# Patient Record
Sex: Male | Born: 1950 | Race: White | Hispanic: No | Marital: Married | Smoking: Never smoker
Health system: Southern US, Community
[De-identification: ages and names within clinical notes are randomized; demographics above are authoritative.]

## PROBLEM LIST (undated history)

## (undated) DIAGNOSIS — E7800 Pure hypercholesterolemia, unspecified: Secondary | ICD-10-CM

## (undated) DIAGNOSIS — K2970 Gastritis, unspecified, without bleeding: Secondary | ICD-10-CM

## (undated) DIAGNOSIS — N200 Calculus of kidney: Secondary | ICD-10-CM

## (undated) DIAGNOSIS — M1990 Unspecified osteoarthritis, unspecified site: Secondary | ICD-10-CM

## (undated) DIAGNOSIS — G894 Chronic pain syndrome: Secondary | ICD-10-CM

## (undated) DIAGNOSIS — M5136 Other intervertebral disc degeneration, lumbar region: Secondary | ICD-10-CM

## (undated) DIAGNOSIS — M51369 Other intervertebral disc degeneration, lumbar region without mention of lumbar back pain or lower extremity pain: Secondary | ICD-10-CM

## (undated) DIAGNOSIS — Z9889 Other specified postprocedural states: Secondary | ICD-10-CM

## (undated) DIAGNOSIS — K219 Gastro-esophageal reflux disease without esophagitis: Secondary | ICD-10-CM

## (undated) HISTORY — DX: Chronic pain syndrome: G89.4

## (undated) HISTORY — DX: Pure hypercholesterolemia, unspecified: E78.00

## (undated) HISTORY — DX: Calculus of kidney: N20.0

## (undated) HISTORY — DX: Other specified postprocedural states: Z98.890

## (undated) HISTORY — DX: Gastritis, unspecified, without bleeding: K29.70

## (undated) HISTORY — DX: Other intervertebral disc degeneration, lumbar region without mention of lumbar back pain or lower extremity pain: M51.369

## (undated) HISTORY — PX: SHOULDER SURGERY: SHX246

## (undated) HISTORY — DX: Gastro-esophageal reflux disease without esophagitis: K21.9

## (undated) HISTORY — DX: Other intervertebral disc degeneration, lumbar region: M51.36

## (undated) HISTORY — PX: CARPAL TUNNEL RELEASE: SHX101

## (undated) HISTORY — DX: Unspecified osteoarthritis, unspecified site: M19.90

---

## 2014-10-05 DIAGNOSIS — K5900 Constipation, unspecified: Secondary | ICD-10-CM | POA: Diagnosis not present

## 2014-10-05 DIAGNOSIS — E785 Hyperlipidemia, unspecified: Secondary | ICD-10-CM | POA: Diagnosis not present

## 2014-10-05 DIAGNOSIS — G5600 Carpal tunnel syndrome, unspecified upper limb: Secondary | ICD-10-CM | POA: Diagnosis not present

## 2014-10-05 DIAGNOSIS — G894 Chronic pain syndrome: Secondary | ICD-10-CM | POA: Diagnosis not present

## 2014-10-05 DIAGNOSIS — N399 Disorder of urinary system, unspecified: Secondary | ICD-10-CM | POA: Diagnosis not present

## 2014-10-05 DIAGNOSIS — M549 Dorsalgia, unspecified: Secondary | ICD-10-CM | POA: Diagnosis not present

## 2014-10-15 DIAGNOSIS — G5601 Carpal tunnel syndrome, right upper limb: Secondary | ICD-10-CM | POA: Diagnosis not present

## 2014-10-15 DIAGNOSIS — G5602 Carpal tunnel syndrome, left upper limb: Secondary | ICD-10-CM | POA: Diagnosis not present

## 2014-11-04 DIAGNOSIS — G5601 Carpal tunnel syndrome, right upper limb: Secondary | ICD-10-CM | POA: Diagnosis not present

## 2015-01-25 DIAGNOSIS — M549 Dorsalgia, unspecified: Secondary | ICD-10-CM | POA: Diagnosis not present

## 2015-01-25 DIAGNOSIS — G5600 Carpal tunnel syndrome, unspecified upper limb: Secondary | ICD-10-CM | POA: Diagnosis not present

## 2015-01-25 DIAGNOSIS — Z0000 Encounter for general adult medical examination without abnormal findings: Secondary | ICD-10-CM | POA: Diagnosis not present

## 2015-01-25 DIAGNOSIS — E785 Hyperlipidemia, unspecified: Secondary | ICD-10-CM | POA: Diagnosis not present

## 2015-01-25 DIAGNOSIS — K5900 Constipation, unspecified: Secondary | ICD-10-CM | POA: Diagnosis not present

## 2015-01-25 DIAGNOSIS — N399 Disorder of urinary system, unspecified: Secondary | ICD-10-CM | POA: Diagnosis not present

## 2015-01-25 DIAGNOSIS — G894 Chronic pain syndrome: Secondary | ICD-10-CM | POA: Diagnosis not present

## 2015-01-25 DIAGNOSIS — Z125 Encounter for screening for malignant neoplasm of prostate: Secondary | ICD-10-CM | POA: Diagnosis not present

## 2015-06-07 DIAGNOSIS — K589 Irritable bowel syndrome without diarrhea: Secondary | ICD-10-CM | POA: Diagnosis not present

## 2015-06-07 DIAGNOSIS — K5900 Constipation, unspecified: Secondary | ICD-10-CM | POA: Diagnosis not present

## 2015-08-25 DIAGNOSIS — E7800 Pure hypercholesterolemia, unspecified: Secondary | ICD-10-CM | POA: Diagnosis not present

## 2015-08-25 DIAGNOSIS — Z0000 Encounter for general adult medical examination without abnormal findings: Secondary | ICD-10-CM | POA: Diagnosis not present

## 2015-08-25 DIAGNOSIS — Z8669 Personal history of other diseases of the nervous system and sense organs: Secondary | ICD-10-CM | POA: Diagnosis not present

## 2015-08-25 DIAGNOSIS — K589 Irritable bowel syndrome without diarrhea: Secondary | ICD-10-CM | POA: Diagnosis not present

## 2015-08-25 DIAGNOSIS — G8929 Other chronic pain: Secondary | ICD-10-CM | POA: Diagnosis not present

## 2015-08-25 DIAGNOSIS — Z79899 Other long term (current) drug therapy: Secondary | ICD-10-CM | POA: Diagnosis not present

## 2015-08-25 DIAGNOSIS — R05 Cough: Secondary | ICD-10-CM | POA: Diagnosis not present

## 2015-08-30 DIAGNOSIS — R0789 Other chest pain: Secondary | ICD-10-CM | POA: Diagnosis not present

## 2015-08-30 DIAGNOSIS — R05 Cough: Secondary | ICD-10-CM | POA: Diagnosis not present

## 2016-05-24 DIAGNOSIS — Z125 Encounter for screening for malignant neoplasm of prostate: Secondary | ICD-10-CM | POA: Diagnosis not present

## 2016-05-24 DIAGNOSIS — E7800 Pure hypercholesterolemia, unspecified: Secondary | ICD-10-CM | POA: Diagnosis not present

## 2016-05-24 DIAGNOSIS — Z79899 Other long term (current) drug therapy: Secondary | ICD-10-CM | POA: Diagnosis not present

## 2016-05-24 DIAGNOSIS — R7301 Impaired fasting glucose: Secondary | ICD-10-CM | POA: Diagnosis not present

## 2016-05-24 DIAGNOSIS — R946 Abnormal results of thyroid function studies: Secondary | ICD-10-CM | POA: Diagnosis not present

## 2016-05-30 DIAGNOSIS — Z0000 Encounter for general adult medical examination without abnormal findings: Secondary | ICD-10-CM | POA: Diagnosis not present

## 2016-05-30 DIAGNOSIS — E7800 Pure hypercholesterolemia, unspecified: Secondary | ICD-10-CM | POA: Diagnosis not present

## 2016-05-30 DIAGNOSIS — R300 Dysuria: Secondary | ICD-10-CM | POA: Diagnosis not present

## 2016-05-30 DIAGNOSIS — Z79899 Other long term (current) drug therapy: Secondary | ICD-10-CM | POA: Diagnosis not present

## 2016-05-30 DIAGNOSIS — K589 Irritable bowel syndrome without diarrhea: Secondary | ICD-10-CM | POA: Diagnosis not present

## 2016-05-30 DIAGNOSIS — Z0289 Encounter for other administrative examinations: Secondary | ICD-10-CM | POA: Diagnosis not present

## 2016-05-30 DIAGNOSIS — G8929 Other chronic pain: Secondary | ICD-10-CM | POA: Diagnosis not present

## 2016-05-30 DIAGNOSIS — M25512 Pain in left shoulder: Secondary | ICD-10-CM | POA: Insufficient documentation

## 2016-05-30 HISTORY — DX: Pain in left shoulder: M25.512

## 2016-06-01 DIAGNOSIS — Z79891 Long term (current) use of opiate analgesic: Secondary | ICD-10-CM | POA: Insufficient documentation

## 2016-06-01 DIAGNOSIS — K589 Irritable bowel syndrome without diarrhea: Secondary | ICD-10-CM | POA: Insufficient documentation

## 2016-06-01 DIAGNOSIS — Z79899 Other long term (current) drug therapy: Secondary | ICD-10-CM | POA: Insufficient documentation

## 2016-06-01 HISTORY — DX: Other long term (current) drug therapy: Z79.899

## 2016-06-01 HISTORY — DX: Long term (current) use of opiate analgesic: Z79.891

## 2016-06-01 HISTORY — DX: Irritable bowel syndrome, unspecified: K58.9

## 2016-06-08 DIAGNOSIS — M25512 Pain in left shoulder: Secondary | ICD-10-CM | POA: Diagnosis not present

## 2016-06-12 DIAGNOSIS — X58XXXA Exposure to other specified factors, initial encounter: Secondary | ICD-10-CM | POA: Diagnosis not present

## 2016-06-12 DIAGNOSIS — S46812A Strain of other muscles, fascia and tendons at shoulder and upper arm level, left arm, initial encounter: Secondary | ICD-10-CM | POA: Diagnosis not present

## 2016-06-12 DIAGNOSIS — M12812 Other specific arthropathies, not elsewhere classified, left shoulder: Secondary | ICD-10-CM | POA: Diagnosis not present

## 2016-06-12 DIAGNOSIS — M75112 Incomplete rotator cuff tear or rupture of left shoulder, not specified as traumatic: Secondary | ICD-10-CM | POA: Diagnosis not present

## 2016-06-14 DIAGNOSIS — M25512 Pain in left shoulder: Secondary | ICD-10-CM | POA: Diagnosis not present

## 2016-06-14 DIAGNOSIS — M75122 Complete rotator cuff tear or rupture of left shoulder, not specified as traumatic: Secondary | ICD-10-CM | POA: Diagnosis not present

## 2016-06-26 DIAGNOSIS — R1031 Right lower quadrant pain: Secondary | ICD-10-CM | POA: Diagnosis not present

## 2016-06-28 DIAGNOSIS — I700 Atherosclerosis of aorta: Secondary | ICD-10-CM | POA: Diagnosis not present

## 2016-06-28 DIAGNOSIS — R1031 Right lower quadrant pain: Secondary | ICD-10-CM | POA: Diagnosis not present

## 2016-06-28 DIAGNOSIS — N200 Calculus of kidney: Secondary | ICD-10-CM | POA: Diagnosis not present

## 2016-06-29 DIAGNOSIS — H6502 Acute serous otitis media, left ear: Secondary | ICD-10-CM | POA: Diagnosis not present

## 2016-07-12 DIAGNOSIS — R1011 Right upper quadrant pain: Secondary | ICD-10-CM | POA: Diagnosis not present

## 2016-07-12 DIAGNOSIS — R1031 Right lower quadrant pain: Secondary | ICD-10-CM | POA: Diagnosis not present

## 2016-08-09 DIAGNOSIS — R030 Elevated blood-pressure reading, without diagnosis of hypertension: Secondary | ICD-10-CM | POA: Diagnosis not present

## 2016-08-09 DIAGNOSIS — M4150 Other secondary scoliosis, site unspecified: Secondary | ICD-10-CM | POA: Diagnosis not present

## 2016-08-09 DIAGNOSIS — M4316 Spondylolisthesis, lumbar region: Secondary | ICD-10-CM | POA: Diagnosis not present

## 2016-08-09 DIAGNOSIS — M4727 Other spondylosis with radiculopathy, lumbosacral region: Secondary | ICD-10-CM | POA: Diagnosis not present

## 2016-08-11 ENCOUNTER — Other Ambulatory Visit: Payer: Self-pay | Admitting: Neurological Surgery

## 2016-08-11 DIAGNOSIS — M4316 Spondylolisthesis, lumbar region: Secondary | ICD-10-CM

## 2016-08-11 DIAGNOSIS — M546 Pain in thoracic spine: Secondary | ICD-10-CM

## 2016-08-11 DIAGNOSIS — M542 Cervicalgia: Secondary | ICD-10-CM

## 2016-08-25 ENCOUNTER — Ambulatory Visit
Admission: RE | Admit: 2016-08-25 | Discharge: 2016-08-25 | Disposition: A | Discharge status: Home / Self Care | Payer: Self-pay | Source: Ambulatory Visit | Attending: Neurological Surgery | Admitting: Neurological Surgery

## 2016-08-25 ENCOUNTER — Ambulatory Visit
Admission: RE | Admit: 2016-08-25 | Discharge: 2016-08-25 | Disposition: A | Discharge status: Home / Self Care | Payer: Medicare Other | Source: Ambulatory Visit | Attending: Neurological Surgery | Admitting: Neurological Surgery

## 2016-08-25 DIAGNOSIS — M546 Pain in thoracic spine: Secondary | ICD-10-CM

## 2016-08-25 DIAGNOSIS — M5126 Other intervertebral disc displacement, lumbar region: Secondary | ICD-10-CM | POA: Diagnosis not present

## 2016-08-25 DIAGNOSIS — M50221 Other cervical disc displacement at C4-C5 level: Secondary | ICD-10-CM | POA: Diagnosis not present

## 2016-08-25 DIAGNOSIS — M542 Cervicalgia: Secondary | ICD-10-CM

## 2016-08-25 DIAGNOSIS — M5124 Other intervertebral disc displacement, thoracic region: Secondary | ICD-10-CM | POA: Diagnosis not present

## 2016-08-25 DIAGNOSIS — M4316 Spondylolisthesis, lumbar region: Secondary | ICD-10-CM

## 2016-08-31 DIAGNOSIS — R030 Elevated blood-pressure reading, without diagnosis of hypertension: Secondary | ICD-10-CM | POA: Diagnosis not present

## 2016-08-31 DIAGNOSIS — M4727 Other spondylosis with radiculopathy, lumbosacral region: Secondary | ICD-10-CM | POA: Diagnosis not present

## 2016-08-31 DIAGNOSIS — M4150 Other secondary scoliosis, site unspecified: Secondary | ICD-10-CM | POA: Diagnosis not present

## 2016-08-31 DIAGNOSIS — Z6825 Body mass index (BMI) 25.0-25.9, adult: Secondary | ICD-10-CM | POA: Diagnosis not present

## 2016-08-31 DIAGNOSIS — M438X9 Other specified deforming dorsopathies, site unspecified: Secondary | ICD-10-CM | POA: Diagnosis not present

## 2016-08-31 DIAGNOSIS — M4317 Spondylolisthesis, lumbosacral region: Secondary | ICD-10-CM | POA: Diagnosis not present

## 2016-10-05 DIAGNOSIS — H9190 Unspecified hearing loss, unspecified ear: Secondary | ICD-10-CM | POA: Diagnosis not present

## 2016-10-05 DIAGNOSIS — H9319 Tinnitus, unspecified ear: Secondary | ICD-10-CM | POA: Diagnosis not present

## 2016-10-05 DIAGNOSIS — H938X1 Other specified disorders of right ear: Secondary | ICD-10-CM | POA: Diagnosis not present

## 2016-10-05 DIAGNOSIS — J342 Deviated nasal septum: Secondary | ICD-10-CM | POA: Diagnosis not present

## 2016-10-12 DIAGNOSIS — H903 Sensorineural hearing loss, bilateral: Secondary | ICD-10-CM | POA: Diagnosis not present

## 2016-10-12 DIAGNOSIS — H9319 Tinnitus, unspecified ear: Secondary | ICD-10-CM | POA: Diagnosis not present

## 2016-11-20 DIAGNOSIS — M4317 Spondylolisthesis, lumbosacral region: Secondary | ICD-10-CM | POA: Diagnosis not present

## 2016-11-20 DIAGNOSIS — M4727 Other spondylosis with radiculopathy, lumbosacral region: Secondary | ICD-10-CM | POA: Diagnosis not present

## 2016-11-20 DIAGNOSIS — R030 Elevated blood-pressure reading, without diagnosis of hypertension: Secondary | ICD-10-CM | POA: Diagnosis not present

## 2016-11-20 DIAGNOSIS — M438X9 Other specified deforming dorsopathies, site unspecified: Secondary | ICD-10-CM | POA: Diagnosis not present

## 2016-11-20 DIAGNOSIS — M4150 Other secondary scoliosis, site unspecified: Secondary | ICD-10-CM | POA: Diagnosis not present

## 2016-12-06 DIAGNOSIS — R030 Elevated blood-pressure reading, without diagnosis of hypertension: Secondary | ICD-10-CM | POA: Diagnosis not present

## 2016-12-06 DIAGNOSIS — M4150 Other secondary scoliosis, site unspecified: Secondary | ICD-10-CM | POA: Diagnosis not present

## 2017-05-09 DIAGNOSIS — M5136 Other intervertebral disc degeneration, lumbar region: Secondary | ICD-10-CM | POA: Diagnosis not present

## 2017-05-09 DIAGNOSIS — R109 Unspecified abdominal pain: Secondary | ICD-10-CM | POA: Diagnosis not present

## 2017-05-09 DIAGNOSIS — G894 Chronic pain syndrome: Secondary | ICD-10-CM | POA: Diagnosis not present

## 2017-05-09 DIAGNOSIS — Z1389 Encounter for screening for other disorder: Secondary | ICD-10-CM | POA: Diagnosis not present

## 2017-05-09 DIAGNOSIS — L0292 Furuncle, unspecified: Secondary | ICD-10-CM | POA: Diagnosis not present

## 2017-05-09 DIAGNOSIS — E7800 Pure hypercholesterolemia, unspecified: Secondary | ICD-10-CM | POA: Diagnosis not present

## 2017-05-16 DIAGNOSIS — L723 Sebaceous cyst: Secondary | ICD-10-CM | POA: Diagnosis not present

## 2017-05-16 DIAGNOSIS — L02212 Cutaneous abscess of back [any part, except buttock]: Secondary | ICD-10-CM | POA: Diagnosis not present

## 2017-05-16 DIAGNOSIS — L720 Epidermal cyst: Secondary | ICD-10-CM | POA: Diagnosis not present

## 2017-05-30 DIAGNOSIS — Z86010 Personal history of colonic polyps: Secondary | ICD-10-CM | POA: Diagnosis not present

## 2017-05-30 DIAGNOSIS — K2900 Acute gastritis without bleeding: Secondary | ICD-10-CM | POA: Diagnosis not present

## 2017-05-30 DIAGNOSIS — Z800 Family history of malignant neoplasm of digestive organs: Secondary | ICD-10-CM | POA: Diagnosis not present

## 2017-05-30 DIAGNOSIS — R1031 Right lower quadrant pain: Secondary | ICD-10-CM | POA: Diagnosis not present

## 2017-06-06 DIAGNOSIS — Z79899 Other long term (current) drug therapy: Secondary | ICD-10-CM | POA: Diagnosis not present

## 2017-06-06 DIAGNOSIS — M19011 Primary osteoarthritis, right shoulder: Secondary | ICD-10-CM | POA: Diagnosis not present

## 2017-06-06 DIAGNOSIS — M5030 Other cervical disc degeneration, unspecified cervical region: Secondary | ICD-10-CM | POA: Diagnosis not present

## 2017-06-06 DIAGNOSIS — M5136 Other intervertebral disc degeneration, lumbar region: Secondary | ICD-10-CM | POA: Diagnosis not present

## 2017-07-05 DIAGNOSIS — G4700 Insomnia, unspecified: Secondary | ICD-10-CM | POA: Diagnosis not present

## 2017-07-06 DIAGNOSIS — M5136 Other intervertebral disc degeneration, lumbar region: Secondary | ICD-10-CM | POA: Diagnosis not present

## 2017-07-06 DIAGNOSIS — Z79899 Other long term (current) drug therapy: Secondary | ICD-10-CM | POA: Diagnosis not present

## 2017-07-06 DIAGNOSIS — M19011 Primary osteoarthritis, right shoulder: Secondary | ICD-10-CM | POA: Diagnosis not present

## 2017-07-06 DIAGNOSIS — M5030 Other cervical disc degeneration, unspecified cervical region: Secondary | ICD-10-CM | POA: Diagnosis not present

## 2017-07-11 ENCOUNTER — Encounter: Payer: Self-pay | Admitting: Gastroenterology

## 2017-07-11 DIAGNOSIS — K219 Gastro-esophageal reflux disease without esophagitis: Secondary | ICD-10-CM | POA: Diagnosis not present

## 2017-07-11 DIAGNOSIS — F419 Anxiety disorder, unspecified: Secondary | ICD-10-CM | POA: Diagnosis not present

## 2017-07-11 DIAGNOSIS — Z800 Family history of malignant neoplasm of digestive organs: Secondary | ICD-10-CM | POA: Diagnosis not present

## 2017-07-11 DIAGNOSIS — Z8371 Family history of colonic polyps: Secondary | ICD-10-CM | POA: Diagnosis not present

## 2017-07-11 DIAGNOSIS — Z79899 Other long term (current) drug therapy: Secondary | ICD-10-CM | POA: Diagnosis not present

## 2017-07-11 DIAGNOSIS — K2900 Acute gastritis without bleeding: Secondary | ICD-10-CM | POA: Diagnosis not present

## 2017-07-11 DIAGNOSIS — E785 Hyperlipidemia, unspecified: Secondary | ICD-10-CM | POA: Diagnosis not present

## 2017-07-11 DIAGNOSIS — K208 Other esophagitis: Secondary | ICD-10-CM | POA: Diagnosis not present

## 2017-07-11 DIAGNOSIS — K259 Gastric ulcer, unspecified as acute or chronic, without hemorrhage or perforation: Secondary | ICD-10-CM | POA: Diagnosis not present

## 2017-07-11 DIAGNOSIS — Z86010 Personal history of colonic polyps: Secondary | ICD-10-CM | POA: Diagnosis not present

## 2017-07-11 DIAGNOSIS — R1013 Epigastric pain: Secondary | ICD-10-CM | POA: Diagnosis not present

## 2017-07-11 DIAGNOSIS — K2901 Acute gastritis with bleeding: Secondary | ICD-10-CM | POA: Diagnosis not present

## 2017-07-11 DIAGNOSIS — Z1211 Encounter for screening for malignant neoplasm of colon: Secondary | ICD-10-CM | POA: Diagnosis not present

## 2017-07-31 HISTORY — PX: COLONOSCOPY WITH ESOPHAGOGASTRODUODENOSCOPY (EGD): SHX5779

## 2017-08-02 DIAGNOSIS — M545 Low back pain: Secondary | ICD-10-CM | POA: Diagnosis not present

## 2017-08-02 DIAGNOSIS — M5134 Other intervertebral disc degeneration, thoracic region: Secondary | ICD-10-CM | POA: Diagnosis not present

## 2017-08-02 DIAGNOSIS — M4310 Spondylolisthesis, site unspecified: Secondary | ICD-10-CM | POA: Diagnosis not present

## 2017-08-02 DIAGNOSIS — M549 Dorsalgia, unspecified: Secondary | ICD-10-CM | POA: Diagnosis not present

## 2017-08-02 DIAGNOSIS — M5136 Other intervertebral disc degeneration, lumbar region: Secondary | ICD-10-CM | POA: Diagnosis not present

## 2017-08-02 DIAGNOSIS — M542 Cervicalgia: Secondary | ICD-10-CM | POA: Diagnosis not present

## 2017-08-02 DIAGNOSIS — M546 Pain in thoracic spine: Secondary | ICD-10-CM | POA: Diagnosis not present

## 2017-08-02 DIAGNOSIS — M4316 Spondylolisthesis, lumbar region: Secondary | ICD-10-CM | POA: Diagnosis not present

## 2017-08-03 DIAGNOSIS — M5030 Other cervical disc degeneration, unspecified cervical region: Secondary | ICD-10-CM | POA: Diagnosis not present

## 2017-08-03 DIAGNOSIS — M5134 Other intervertebral disc degeneration, thoracic region: Secondary | ICD-10-CM | POA: Diagnosis not present

## 2017-08-03 DIAGNOSIS — M19011 Primary osteoarthritis, right shoulder: Secondary | ICD-10-CM | POA: Diagnosis not present

## 2017-08-03 DIAGNOSIS — M5136 Other intervertebral disc degeneration, lumbar region: Secondary | ICD-10-CM | POA: Diagnosis not present

## 2017-08-03 DIAGNOSIS — Z79899 Other long term (current) drug therapy: Secondary | ICD-10-CM | POA: Diagnosis not present

## 2017-08-07 DIAGNOSIS — Z79899 Other long term (current) drug therapy: Secondary | ICD-10-CM | POA: Diagnosis not present

## 2017-08-07 DIAGNOSIS — M542 Cervicalgia: Secondary | ICD-10-CM | POA: Diagnosis not present

## 2017-08-07 DIAGNOSIS — S299XXA Unspecified injury of thorax, initial encounter: Secondary | ICD-10-CM | POA: Diagnosis not present

## 2017-09-17 ENCOUNTER — Encounter: Payer: Self-pay | Admitting: Gastroenterology

## 2017-09-17 DIAGNOSIS — K648 Other hemorrhoids: Secondary | ICD-10-CM | POA: Diagnosis not present

## 2017-09-17 DIAGNOSIS — Z79899 Other long term (current) drug therapy: Secondary | ICD-10-CM | POA: Diagnosis not present

## 2017-09-17 DIAGNOSIS — K5730 Diverticulosis of large intestine without perforation or abscess without bleeding: Secondary | ICD-10-CM | POA: Diagnosis not present

## 2017-09-17 DIAGNOSIS — E785 Hyperlipidemia, unspecified: Secondary | ICD-10-CM | POA: Diagnosis not present

## 2017-09-17 DIAGNOSIS — K5750 Diverticulosis of both small and large intestine without perforation or abscess without bleeding: Secondary | ICD-10-CM | POA: Diagnosis not present

## 2017-09-17 DIAGNOSIS — Z86010 Personal history of colonic polyps: Secondary | ICD-10-CM | POA: Diagnosis not present

## 2017-09-17 DIAGNOSIS — Z1211 Encounter for screening for malignant neoplasm of colon: Secondary | ICD-10-CM | POA: Diagnosis not present

## 2017-09-17 DIAGNOSIS — Z800 Family history of malignant neoplasm of digestive organs: Secondary | ICD-10-CM | POA: Diagnosis not present

## 2017-10-03 DIAGNOSIS — Z79899 Other long term (current) drug therapy: Secondary | ICD-10-CM | POA: Diagnosis not present

## 2017-10-03 DIAGNOSIS — M19011 Primary osteoarthritis, right shoulder: Secondary | ICD-10-CM | POA: Diagnosis not present

## 2017-10-03 DIAGNOSIS — M5136 Other intervertebral disc degeneration, lumbar region: Secondary | ICD-10-CM | POA: Diagnosis not present

## 2017-10-05 DIAGNOSIS — K219 Gastro-esophageal reflux disease without esophagitis: Secondary | ICD-10-CM | POA: Diagnosis not present

## 2017-10-05 DIAGNOSIS — G4700 Insomnia, unspecified: Secondary | ICD-10-CM | POA: Diagnosis not present

## 2017-11-07 DIAGNOSIS — M5136 Other intervertebral disc degeneration, lumbar region: Secondary | ICD-10-CM | POA: Diagnosis not present

## 2017-11-07 DIAGNOSIS — M19012 Primary osteoarthritis, left shoulder: Secondary | ICD-10-CM | POA: Diagnosis not present

## 2017-11-07 DIAGNOSIS — Z79899 Other long term (current) drug therapy: Secondary | ICD-10-CM | POA: Diagnosis not present

## 2017-11-23 DIAGNOSIS — E559 Vitamin D deficiency, unspecified: Secondary | ICD-10-CM | POA: Diagnosis not present

## 2017-11-23 DIAGNOSIS — M2550 Pain in unspecified joint: Secondary | ICD-10-CM | POA: Diagnosis not present

## 2017-11-23 DIAGNOSIS — R0600 Dyspnea, unspecified: Secondary | ICD-10-CM | POA: Diagnosis not present

## 2017-11-23 DIAGNOSIS — D519 Vitamin B12 deficiency anemia, unspecified: Secondary | ICD-10-CM | POA: Diagnosis not present

## 2017-11-23 DIAGNOSIS — R5383 Other fatigue: Secondary | ICD-10-CM | POA: Diagnosis not present

## 2017-12-06 DIAGNOSIS — M5030 Other cervical disc degeneration, unspecified cervical region: Secondary | ICD-10-CM | POA: Diagnosis not present

## 2017-12-06 DIAGNOSIS — M5134 Other intervertebral disc degeneration, thoracic region: Secondary | ICD-10-CM | POA: Diagnosis not present

## 2017-12-06 DIAGNOSIS — M5136 Other intervertebral disc degeneration, lumbar region: Secondary | ICD-10-CM | POA: Diagnosis not present

## 2017-12-06 DIAGNOSIS — Z79899 Other long term (current) drug therapy: Secondary | ICD-10-CM | POA: Diagnosis not present

## 2017-12-06 DIAGNOSIS — M19012 Primary osteoarthritis, left shoulder: Secondary | ICD-10-CM | POA: Diagnosis not present

## 2018-01-04 DIAGNOSIS — M5134 Other intervertebral disc degeneration, thoracic region: Secondary | ICD-10-CM | POA: Diagnosis not present

## 2018-01-04 DIAGNOSIS — M5136 Other intervertebral disc degeneration, lumbar region: Secondary | ICD-10-CM | POA: Diagnosis not present

## 2018-01-04 DIAGNOSIS — Z79899 Other long term (current) drug therapy: Secondary | ICD-10-CM | POA: Diagnosis not present

## 2018-01-04 DIAGNOSIS — M5030 Other cervical disc degeneration, unspecified cervical region: Secondary | ICD-10-CM | POA: Diagnosis not present

## 2018-01-04 DIAGNOSIS — M19012 Primary osteoarthritis, left shoulder: Secondary | ICD-10-CM | POA: Diagnosis not present

## 2018-01-25 DIAGNOSIS — K219 Gastro-esophageal reflux disease without esophagitis: Secondary | ICD-10-CM | POA: Diagnosis not present

## 2018-02-01 DIAGNOSIS — M5134 Other intervertebral disc degeneration, thoracic region: Secondary | ICD-10-CM | POA: Diagnosis not present

## 2018-02-01 DIAGNOSIS — M19012 Primary osteoarthritis, left shoulder: Secondary | ICD-10-CM | POA: Diagnosis not present

## 2018-02-01 DIAGNOSIS — M5030 Other cervical disc degeneration, unspecified cervical region: Secondary | ICD-10-CM | POA: Diagnosis not present

## 2018-02-01 DIAGNOSIS — Z79899 Other long term (current) drug therapy: Secondary | ICD-10-CM | POA: Diagnosis not present

## 2018-03-04 DIAGNOSIS — Z79899 Other long term (current) drug therapy: Secondary | ICD-10-CM | POA: Diagnosis not present

## 2018-03-04 DIAGNOSIS — M5136 Other intervertebral disc degeneration, lumbar region: Secondary | ICD-10-CM | POA: Diagnosis not present

## 2018-03-04 DIAGNOSIS — M5030 Other cervical disc degeneration, unspecified cervical region: Secondary | ICD-10-CM | POA: Diagnosis not present

## 2018-03-04 DIAGNOSIS — M5134 Other intervertebral disc degeneration, thoracic region: Secondary | ICD-10-CM | POA: Diagnosis not present

## 2018-03-14 DIAGNOSIS — K819 Cholecystitis, unspecified: Secondary | ICD-10-CM | POA: Diagnosis not present

## 2018-03-14 DIAGNOSIS — R110 Nausea: Secondary | ICD-10-CM | POA: Diagnosis not present

## 2018-03-14 DIAGNOSIS — R935 Abnormal findings on diagnostic imaging of other abdominal regions, including retroperitoneum: Secondary | ICD-10-CM | POA: Diagnosis not present

## 2018-03-14 DIAGNOSIS — R1032 Left lower quadrant pain: Secondary | ICD-10-CM | POA: Diagnosis not present

## 2018-03-14 DIAGNOSIS — R0600 Dyspnea, unspecified: Secondary | ICD-10-CM | POA: Diagnosis not present

## 2018-03-14 DIAGNOSIS — K838 Other specified diseases of biliary tract: Secondary | ICD-10-CM

## 2018-03-14 DIAGNOSIS — I491 Atrial premature depolarization: Secondary | ICD-10-CM | POA: Diagnosis not present

## 2018-03-14 DIAGNOSIS — R7989 Other specified abnormal findings of blood chemistry: Secondary | ICD-10-CM | POA: Diagnosis not present

## 2018-03-14 DIAGNOSIS — K829 Disease of gallbladder, unspecified: Secondary | ICD-10-CM | POA: Diagnosis not present

## 2018-03-14 DIAGNOSIS — R740 Nonspecific elevation of levels of transaminase and lactic acid dehydrogenase [LDH]: Secondary | ICD-10-CM | POA: Diagnosis not present

## 2018-03-14 DIAGNOSIS — R1011 Right upper quadrant pain: Secondary | ICD-10-CM | POA: Diagnosis not present

## 2018-03-14 DIAGNOSIS — G8929 Other chronic pain: Secondary | ICD-10-CM | POA: Diagnosis not present

## 2018-03-14 DIAGNOSIS — R0602 Shortness of breath: Secondary | ICD-10-CM | POA: Diagnosis not present

## 2018-03-14 DIAGNOSIS — Z8719 Personal history of other diseases of the digestive system: Secondary | ICD-10-CM | POA: Diagnosis not present

## 2018-03-14 DIAGNOSIS — R748 Abnormal levels of other serum enzymes: Secondary | ICD-10-CM | POA: Diagnosis not present

## 2018-03-14 DIAGNOSIS — E7800 Pure hypercholesterolemia, unspecified: Secondary | ICD-10-CM | POA: Diagnosis not present

## 2018-03-14 DIAGNOSIS — R7401 Elevation of levels of liver transaminase levels: Secondary | ICD-10-CM

## 2018-03-14 DIAGNOSIS — R509 Fever, unspecified: Secondary | ICD-10-CM | POA: Diagnosis not present

## 2018-03-14 DIAGNOSIS — R6883 Chills (without fever): Secondary | ICD-10-CM | POA: Diagnosis not present

## 2018-03-14 DIAGNOSIS — R195 Other fecal abnormalities: Secondary | ICD-10-CM | POA: Diagnosis not present

## 2018-03-14 DIAGNOSIS — K589 Irritable bowel syndrome without diarrhea: Secondary | ICD-10-CM | POA: Diagnosis not present

## 2018-03-14 HISTORY — DX: Elevation of levels of liver transaminase levels: R74.01

## 2018-03-14 HISTORY — PX: CHOLECYSTECTOMY: SHX55

## 2018-03-14 HISTORY — DX: Other specified diseases of biliary tract: K83.8

## 2018-03-15 DIAGNOSIS — K810 Acute cholecystitis: Secondary | ICD-10-CM | POA: Diagnosis not present

## 2018-03-15 DIAGNOSIS — K8010 Calculus of gallbladder with chronic cholecystitis without obstruction: Secondary | ICD-10-CM | POA: Diagnosis not present

## 2018-03-15 DIAGNOSIS — K819 Cholecystitis, unspecified: Secondary | ICD-10-CM | POA: Diagnosis not present

## 2018-03-21 DIAGNOSIS — K219 Gastro-esophageal reflux disease without esophagitis: Secondary | ICD-10-CM | POA: Diagnosis not present

## 2018-04-03 DIAGNOSIS — Z79899 Other long term (current) drug therapy: Secondary | ICD-10-CM | POA: Diagnosis not present

## 2018-04-03 DIAGNOSIS — M5136 Other intervertebral disc degeneration, lumbar region: Secondary | ICD-10-CM | POA: Diagnosis not present

## 2018-04-03 DIAGNOSIS — M5134 Other intervertebral disc degeneration, thoracic region: Secondary | ICD-10-CM | POA: Diagnosis not present

## 2018-04-03 DIAGNOSIS — M19012 Primary osteoarthritis, left shoulder: Secondary | ICD-10-CM | POA: Diagnosis not present

## 2018-04-03 DIAGNOSIS — M5030 Other cervical disc degeneration, unspecified cervical region: Secondary | ICD-10-CM | POA: Diagnosis not present

## 2018-05-02 DIAGNOSIS — M5030 Other cervical disc degeneration, unspecified cervical region: Secondary | ICD-10-CM | POA: Diagnosis not present

## 2018-05-02 DIAGNOSIS — M19012 Primary osteoarthritis, left shoulder: Secondary | ICD-10-CM | POA: Diagnosis not present

## 2018-05-02 DIAGNOSIS — M5136 Other intervertebral disc degeneration, lumbar region: Secondary | ICD-10-CM | POA: Diagnosis not present

## 2018-05-02 DIAGNOSIS — Z79899 Other long term (current) drug therapy: Secondary | ICD-10-CM | POA: Diagnosis not present

## 2018-05-20 DIAGNOSIS — M5416 Radiculopathy, lumbar region: Secondary | ICD-10-CM | POA: Diagnosis not present

## 2018-05-20 DIAGNOSIS — M48061 Spinal stenosis, lumbar region without neurogenic claudication: Secondary | ICD-10-CM | POA: Diagnosis not present

## 2018-05-20 DIAGNOSIS — M4317 Spondylolisthesis, lumbosacral region: Secondary | ICD-10-CM | POA: Diagnosis not present

## 2018-05-20 DIAGNOSIS — M25551 Pain in right hip: Secondary | ICD-10-CM | POA: Diagnosis not present

## 2018-05-24 ENCOUNTER — Encounter: Payer: Self-pay | Admitting: Gastroenterology

## 2018-05-24 DIAGNOSIS — Z1389 Encounter for screening for other disorder: Secondary | ICD-10-CM | POA: Diagnosis not present

## 2018-05-24 DIAGNOSIS — G894 Chronic pain syndrome: Secondary | ICD-10-CM | POA: Diagnosis not present

## 2018-05-24 DIAGNOSIS — K219 Gastro-esophageal reflux disease without esophagitis: Secondary | ICD-10-CM | POA: Diagnosis not present

## 2018-05-24 DIAGNOSIS — Z6823 Body mass index (BMI) 23.0-23.9, adult: Secondary | ICD-10-CM | POA: Diagnosis not present

## 2018-05-24 DIAGNOSIS — E7800 Pure hypercholesterolemia, unspecified: Secondary | ICD-10-CM | POA: Diagnosis not present

## 2018-05-24 DIAGNOSIS — M5136 Other intervertebral disc degeneration, lumbar region: Secondary | ICD-10-CM | POA: Diagnosis not present

## 2018-05-24 DIAGNOSIS — R3911 Hesitancy of micturition: Secondary | ICD-10-CM | POA: Diagnosis not present

## 2018-05-24 DIAGNOSIS — R109 Unspecified abdominal pain: Secondary | ICD-10-CM | POA: Diagnosis not present

## 2018-05-28 DIAGNOSIS — R748 Abnormal levels of other serum enzymes: Secondary | ICD-10-CM | POA: Diagnosis not present

## 2018-05-30 DIAGNOSIS — R109 Unspecified abdominal pain: Secondary | ICD-10-CM | POA: Diagnosis not present

## 2018-06-01 DIAGNOSIS — R932 Abnormal findings on diagnostic imaging of liver and biliary tract: Secondary | ICD-10-CM | POA: Diagnosis not present

## 2018-06-01 DIAGNOSIS — K838 Other specified diseases of biliary tract: Secondary | ICD-10-CM | POA: Diagnosis not present

## 2018-06-01 DIAGNOSIS — R109 Unspecified abdominal pain: Secondary | ICD-10-CM | POA: Diagnosis not present

## 2018-06-03 DIAGNOSIS — M5136 Other intervertebral disc degeneration, lumbar region: Secondary | ICD-10-CM | POA: Diagnosis not present

## 2018-06-03 DIAGNOSIS — M5134 Other intervertebral disc degeneration, thoracic region: Secondary | ICD-10-CM | POA: Diagnosis not present

## 2018-06-03 DIAGNOSIS — M5030 Other cervical disc degeneration, unspecified cervical region: Secondary | ICD-10-CM | POA: Diagnosis not present

## 2018-06-03 DIAGNOSIS — Z79899 Other long term (current) drug therapy: Secondary | ICD-10-CM | POA: Diagnosis not present

## 2018-06-03 DIAGNOSIS — M19012 Primary osteoarthritis, left shoulder: Secondary | ICD-10-CM | POA: Diagnosis not present

## 2018-06-07 ENCOUNTER — Encounter: Payer: Self-pay | Admitting: Gastroenterology

## 2018-06-13 DIAGNOSIS — Z48815 Encounter for surgical aftercare following surgery on the digestive system: Secondary | ICD-10-CM | POA: Diagnosis not present

## 2018-06-13 DIAGNOSIS — R945 Abnormal results of liver function studies: Secondary | ICD-10-CM | POA: Diagnosis not present

## 2018-06-20 ENCOUNTER — Ambulatory Visit: Payer: Medicare Other | Admitting: Gastroenterology

## 2018-06-20 ENCOUNTER — Encounter

## 2018-06-24 ENCOUNTER — Other Ambulatory Visit (INDEPENDENT_AMBULATORY_CARE_PROVIDER_SITE_OTHER): Payer: Medicare Other

## 2018-06-24 ENCOUNTER — Encounter: Payer: Self-pay | Admitting: Gastroenterology

## 2018-06-24 ENCOUNTER — Ambulatory Visit (INDEPENDENT_AMBULATORY_CARE_PROVIDER_SITE_OTHER): Payer: Medicare Other | Admitting: Gastroenterology

## 2018-06-24 ENCOUNTER — Encounter

## 2018-06-24 VITALS — BP 138/84 | HR 72 | Ht 72.0 in | Wt 179.0 lb

## 2018-06-24 DIAGNOSIS — R945 Abnormal results of liver function studies: Secondary | ICD-10-CM

## 2018-06-24 DIAGNOSIS — R7989 Other specified abnormal findings of blood chemistry: Secondary | ICD-10-CM

## 2018-06-24 LAB — CBC WITH DIFFERENTIAL/PLATELET
BASOS ABS: 0.1 10*3/uL (ref 0.0–0.1)
Basophils Relative: 0.8 % (ref 0.0–3.0)
Eosinophils Absolute: 0.1 10*3/uL (ref 0.0–0.7)
Eosinophils Relative: 1.7 % (ref 0.0–5.0)
HEMATOCRIT: 39.8 % (ref 39.0–52.0)
HEMOGLOBIN: 13.1 g/dL (ref 13.0–17.0)
LYMPHS PCT: 22.5 % (ref 12.0–46.0)
Lymphs Abs: 1.9 10*3/uL (ref 0.7–4.0)
MCHC: 32.9 g/dL (ref 30.0–36.0)
MCV: 99.2 fl (ref 78.0–100.0)
MONOS PCT: 12.3 % — AB (ref 3.0–12.0)
Monocytes Absolute: 1.1 10*3/uL — ABNORMAL HIGH (ref 0.1–1.0)
Neutro Abs: 5.4 10*3/uL (ref 1.4–7.7)
Neutrophils Relative %: 62.7 % (ref 43.0–77.0)
Platelets: 335 10*3/uL (ref 150.0–400.0)
RBC: 4.01 Mil/uL — AB (ref 4.22–5.81)
RDW: 14.3 % (ref 11.5–15.5)
WBC: 8.5 10*3/uL (ref 4.0–10.5)

## 2018-06-24 LAB — COMPREHENSIVE METABOLIC PANEL
ALT: 60 U/L — ABNORMAL HIGH (ref 0–53)
AST: 30 U/L (ref 0–37)
Albumin: 4.5 g/dL (ref 3.5–5.2)
Alkaline Phosphatase: 130 U/L — ABNORMAL HIGH (ref 39–117)
BUN: 17 mg/dL (ref 6–23)
CO2: 29 mEq/L (ref 19–32)
Calcium: 10.1 mg/dL (ref 8.4–10.5)
Chloride: 103 mEq/L (ref 96–112)
Creatinine, Ser: 0.91 mg/dL (ref 0.40–1.50)
GFR: 88.36 mL/min (ref >60.00)
Glucose, Bld: 103 mg/dL — ABNORMAL HIGH (ref 70–99)
Potassium: 4.8 mEq/L (ref 3.5–5.1)
Sodium: 139 mEq/L (ref 135–145)
Total Bilirubin: 0.4 mg/dL (ref 0.2–1.2)
Total Protein: 7.4 g/dL (ref 6.0–8.3)

## 2018-06-24 LAB — LIPASE: Lipase: 9 U/L — ABNORMAL LOW (ref 11.0–59.0)

## 2018-06-24 NOTE — Progress Notes (Signed)
Chief Complaint: FU  Referring Provider:  Townsend Roger, MD      ASSESSMENT AND PLAN;   #1.  Elevated LFTs. S/P Lap chole d/t acalculous cholecystitis 03/15/2018, neg MRCP 06/02/2018 for CBD stones (did show increased CBD dilatation from 11 to 13 mm, mild IHBR dilatation, normal PD), neg acute panel 03/2015. LFTs 03/14/2018: AST 109, ALT 123, TB 0.6; 05/24/2018 AST 163, ALT 231, alk phos 191, TB 0.4.  LFTs 06/13/2018 AST 86, ALT 108, alk phos 140, TB 0.5  #2.  Family history of colon cancer (brother at age 74), history of colonic polyps.  Had very poor preparation during attempted colonoscopy 06/2017  Plan: - Check CBC, CMP, lipase today. - If needed, may require hepatic elastography/EUS(if +, ERCP). - I have discussed with the patient regarding repeating colonoscopy after 2-day preparation.  He would like to wait.  He does understand there is small but definite chance of missing colorectal neoplasms. - FU in 12 weeks.  Earlier, if with any new problems.  HPI:    Gary Larson is a 67 y.o. male  Underwent lap chole due to a calculus cholecystitis 03/15/2018 without IOC Had abnormal liver function tests, MRCP 03/14/2018 was negative for CBD stones Patient continued to have abnormal liver function test and underwent another MRCP on 06/02/2018 which did show increased CBD dilatation from 11 to 13 mm without any CBD stones. Patient is completely asymptomatic today and denies having any abdominal pain He denies having any nausea, vomiting, jaundice, dark urine or pale stools. He is here for follow-up visit. He has done extremely well since surgery.   Past Medical History:  Diagnosis Date  . Arthritis   . Elevated cholesterol   . Kidney stones     Past Surgical History:  Procedure Laterality Date  . CARPAL TUNNEL RELEASE Right   . CHOLECYSTECTOMY  03/14/2018  . COLONOSCOPY WITH ESOPHAGOGASTRODUODENOSCOPY (EGD)  2019  . SHOULDER SURGERY Right    4 times resected     Family  History  Problem Relation Age of Onset  . Breast cancer Sister   . Colon cancer Sister   . Diabetes Brother   . Esophageal cancer Brother   . Diabetes Brother     Social History   Tobacco Use  . Smoking status: Never Smoker  . Smokeless tobacco: Never Used  Substance Use Topics  . Alcohol use: Not Currently  . Drug use: Never    Current Outpatient Medications  Medication Sig Dispense Refill  . fentaNYL (DURAGESIC - DOSED MCG/HR) 12 MCG/HR Uses every other day  0  . oxyCODONE (ROXICODONE) 15 MG immediate release tablet Take by mouth 3 (three) times daily.    . pantoprazole (PROTONIX) 20 MG tablet at bedtime.    . simvastatin (ZOCOR) 40 MG tablet Take by mouth daily.     No current facility-administered medications for this visit.     Allergies  Allergen Reactions  . Morphine Itching    Review of Systems:  Constitutional: Denies fever, chills, diaphoresis, appetite change and fatigue.  HEENT: Denies photophobia, eye pain, redness, hearing loss, ear pain, congestion, sore throat, rhinorrhea, sneezing, mouth sores, neck pain, neck stiffness and tinnitus.   Respiratory: Denies SOB, DOE, cough, chest tightness,  and wheezing.   Cardiovascular: Denies chest pain, palpitations and leg swelling.  Genitourinary: Denies dysuria, urgency, frequency, hematuria, flank pain and difficulty urinating.  Musculoskeletal: has myalgias, back pain, joint swelling, arthralgias and gait problem.  Skin: No rash.  Neurological: Denies dizziness,  seizures, syncope, weakness, light-headedness, numbness and headaches.  Hematological: Denies adenopathy. Easy bruising, personal or family bleeding history  Psychiatric/Behavioral: Has anxiety or depression     Physical Exam:    BP 138/84   Pulse 72   Ht 6' (1.829 m)   Wt 179 lb (81.2 kg)   BMI 24.28 kg/m  Filed Weights   06/24/18 1022  Weight: 179 lb (81.2 kg)   Constitutional:  Well-developed, in no acute distress. Psychiatric: Normal  mood and affect. Behavior is normal. HEENT: Pupils normal.  Conjunctivae are normal. No scleral icterus. Neck supple.  Cardiovascular: Normal rate, regular rhythm. No edema Pulmonary/chest: Effort normal and breath sounds normal. No wheezing, rales or rhonchi. Abdominal: Soft, nondistended. Nontender. Bowel sounds active throughout. There are no masses palpable. No hepatomegaly. Rectal:  defered Neurological: Alert and oriented to person place and time. Skin: Skin is warm and dry. No rashes noted. 25 minutes spent with the patient today. Greater than 50% was spent in counseling and coordination of care with the patient    Carmell Austria, MD 06/24/2018, 10:50 AM  Cc: Townsend Roger, MD  Dr Christian Mate.

## 2018-06-24 NOTE — Patient Instructions (Signed)
If you are age 66 or older, your body mass index should be between 23-30. Your Body mass index is 24.28 kg/m. If this is out of the aforementioned range listed, please consider follow up with your Primary Care Provider.  If you are age 964 or younger, your body mass index should be between 19-25. Your Body mass index is 24.28 kg/m. If this is out of the aformentioned range listed, please consider follow up with your Primary Care Provider.   Please go to the lab on the 2nd floor [address] before you leave the office today.   Thank you,  Dr. Lynann Bolognaajesh Gupta

## 2018-07-01 ENCOUNTER — Ambulatory Visit: Payer: Medicare Other | Admitting: Gastroenterology

## 2018-07-02 DIAGNOSIS — M5134 Other intervertebral disc degeneration, thoracic region: Secondary | ICD-10-CM | POA: Diagnosis not present

## 2018-07-02 DIAGNOSIS — M5030 Other cervical disc degeneration, unspecified cervical region: Secondary | ICD-10-CM | POA: Diagnosis not present

## 2018-07-02 DIAGNOSIS — Z79899 Other long term (current) drug therapy: Secondary | ICD-10-CM | POA: Diagnosis not present

## 2018-07-02 DIAGNOSIS — M5136 Other intervertebral disc degeneration, lumbar region: Secondary | ICD-10-CM | POA: Diagnosis not present

## 2018-07-02 DIAGNOSIS — M19012 Primary osteoarthritis, left shoulder: Secondary | ICD-10-CM | POA: Diagnosis not present

## 2018-08-02 DIAGNOSIS — Z79899 Other long term (current) drug therapy: Secondary | ICD-10-CM | POA: Diagnosis not present

## 2018-08-02 DIAGNOSIS — M19012 Primary osteoarthritis, left shoulder: Secondary | ICD-10-CM | POA: Diagnosis not present

## 2018-08-02 DIAGNOSIS — M5136 Other intervertebral disc degeneration, lumbar region: Secondary | ICD-10-CM | POA: Diagnosis not present

## 2018-08-08 DIAGNOSIS — Z23 Encounter for immunization: Secondary | ICD-10-CM | POA: Diagnosis not present

## 2018-08-13 ENCOUNTER — Encounter: Payer: Self-pay | Admitting: Physician Assistant

## 2018-08-13 ENCOUNTER — Telehealth: Payer: Self-pay | Admitting: *Deleted

## 2018-08-13 ENCOUNTER — Other Ambulatory Visit (INDEPENDENT_AMBULATORY_CARE_PROVIDER_SITE_OTHER): Payer: Medicare Other

## 2018-08-13 ENCOUNTER — Ambulatory Visit (INDEPENDENT_AMBULATORY_CARE_PROVIDER_SITE_OTHER): Payer: Medicare Other | Admitting: Physician Assistant

## 2018-08-13 VITALS — BP 118/68 | HR 76 | Ht 72.0 in | Wt 185.0 lb

## 2018-08-13 DIAGNOSIS — R945 Abnormal results of liver function studies: Secondary | ICD-10-CM

## 2018-08-13 DIAGNOSIS — K219 Gastro-esophageal reflux disease without esophagitis: Secondary | ICD-10-CM

## 2018-08-13 DIAGNOSIS — Z800 Family history of malignant neoplasm of digestive organs: Secondary | ICD-10-CM | POA: Diagnosis not present

## 2018-08-13 DIAGNOSIS — R1013 Epigastric pain: Secondary | ICD-10-CM | POA: Diagnosis not present

## 2018-08-13 DIAGNOSIS — Z9049 Acquired absence of other specified parts of digestive tract: Secondary | ICD-10-CM | POA: Diagnosis not present

## 2018-08-13 DIAGNOSIS — R7989 Other specified abnormal findings of blood chemistry: Secondary | ICD-10-CM

## 2018-08-13 LAB — CBC WITH DIFFERENTIAL/PLATELET
Basophils Absolute: 0.1 10*3/uL (ref 0.0–0.1)
Basophils Relative: 0.9 % (ref 0.0–3.0)
Eosinophils Absolute: 0.1 10*3/uL (ref 0.0–0.7)
Eosinophils Relative: 1 % (ref 0.0–5.0)
HCT: 40.6 % (ref 39.0–52.0)
Hemoglobin: 13.6 g/dL (ref 13.0–17.0)
Lymphocytes Relative: 25.8 % (ref 12.0–46.0)
Lymphs Abs: 1.8 10*3/uL (ref 0.7–4.0)
MCHC: 33.5 g/dL (ref 30.0–36.0)
MCV: 97.6 fl (ref 78.0–100.0)
Monocytes Absolute: 0.9 10*3/uL (ref 0.1–1.0)
Monocytes Relative: 13 % — ABNORMAL HIGH (ref 3.0–12.0)
NEUTROS ABS: 4.1 10*3/uL (ref 1.4–7.7)
Neutrophils Relative %: 59.3 % (ref 43.0–77.0)
Platelets: 280 10*3/uL (ref 150.0–400.0)
RBC: 4.16 Mil/uL — ABNORMAL LOW (ref 4.22–5.81)
RDW: 13.3 % (ref 11.5–15.5)
WBC: 6.9 10*3/uL (ref 4.0–10.5)

## 2018-08-13 LAB — COMPREHENSIVE METABOLIC PANEL
ALT: 47 U/L (ref 0–53)
AST: 21 U/L (ref 0–37)
Albumin: 4.4 g/dL (ref 3.5–5.2)
Alkaline Phosphatase: 114 U/L (ref 39–117)
BUN: 15 mg/dL (ref 6–23)
CO2: 29 mEq/L (ref 19–32)
Calcium: 9.9 mg/dL (ref 8.4–10.5)
Chloride: 101 mEq/L (ref 96–112)
Creatinine, Ser: 0.98 mg/dL (ref 0.40–1.50)
GFR: 81.08 mL/min (ref >60.00)
Glucose, Bld: 92 mg/dL (ref 70–99)
Potassium: 4.3 mEq/L (ref 3.5–5.1)
Sodium: 138 mEq/L (ref 135–145)
Total Bilirubin: 0.3 mg/dL (ref 0.2–1.2)
Total Protein: 7.7 g/dL (ref 6.0–8.3)

## 2018-08-13 MED ORDER — ONDANSETRON 4 MG PO TBDP: ORAL_TABLET | ORAL | 0 refills | Status: DC

## 2018-08-13 MED ORDER — PANTOPRAZOLE SODIUM 20 MG PO TBEC: 20.0000 mg | DELAYED_RELEASE_TABLET | Freq: Two times a day (BID) | ORAL | 11 refills | Status: DC

## 2018-08-13 NOTE — Patient Instructions (Addendum)
Your provider has requested that you go to the basement level for lab work before leaving today. Press "B" on the elevator. The lab is located at the first door on the left as you exit the elevator. We sent prescriptions to [address] [place], [place].  1. zofran 4 mg ODT 2. Protonix 20 mg. - take 40 mg twice daily for 2 weeks then go back to 20 mg twice daily.   You have been scheduled for an endoscopy. Please follow written instructions given to you at your visit today. If you use inhalers (even only as needed), please bring them with you on the day of your procedure. Normal BMI (Body Mass Index- based on height and weight) is between 19 and 25. Your BMI today is Body mass index is 25.09 kg/m. Marland Kitchen Please consider follow up  regarding your BMI with your Primary Care Provider. Marland Kitchen

## 2018-08-13 NOTE — Progress Notes (Addendum)
Subjective:    Patient ID: Gary Larson, male    DOB: 24-Dec-1950, 68 y.o.   MRN: [id number]  HPI Finnigan is a pleasant 68 year old white male, known to Dr. Lyndel Safe who was last seen in November 2019, for elevated LFTs.  Pt  had undergone laparoscopic cholecystectomy in August 2019.  He had MRCP done in November 2019 which did show a dilated common bile duct of 11 to 13 mm and mild intrahepatic ductal dilation.  There was mild atrophy in the body and tail of the pancreas, and pancreatic duct prominent at 3 to 12m normal liver parenchyma. Hepatitis serologies were negative. Dr. GLyndel Safehad repeated labs, which showed significant improvement-T bili was 0.4, alk phos 130, AST 30 and ALT of 60, lipase normal and CBC unremarkable. He was to follow-up in 3 months. Patient comes in today with complaints of severe burning type pain in his epigastrium.  He has been on Protonix 20 mg once daily chronically for GERD.  Over the past week he has noticed burning type pain which has frequently been waking him from sleep.  He also had an episode this past weekend that was associated with significant nausea.  Patient is on chronic opioids says he had been off of his pain medications it for couple of days intentionally and the nausea started.  He has gone back on pain meds and nausea has resolved at this point.  He also called his PCP who recommended doubling up on the Protonix which she has been doing over the past few days says he is probably 67 to 67% better.  He did not have any associated fever, no diarrhea no melena or hematochezia.  He denies any aspirin or NSAID use.  He is not typically having a lot of heartburn though he says generally early in the mornings he has a lot of gas and once he releases gas, nausea and epigastric discomfort will subside. He has had prior EGD with Dr. GLyndel Safeand had documented erosive esophagitis LA grade a and erosive gastritis in December 2018.  CLOtest was negative and small bowel biopsies  were unremarkable, esophageal biopsy with no evidence of Barrett's.  Repeat colonoscopy had also been mentioned by Dr. GLyndel Safeas patient has family history of colon cancer in his brother. Last colonoscopy per Dr. GSteve Rattleroffice note was in December 2018 and was a poor prep and therefore he recommended repeat colonoscopy.  Patient states he felt sure that he had another colonoscopy last after that because of the poor prep. [facility]  [facility].  He did have repeat colonoscopy in February 2019 after a more prolonged prep and was noted to have mild pancolonic diverticulosis and otherwise normal exam.  He will be indicated for 5-year interval follow-up.  Review of Systems Pertinent positive and negative review of systems were noted in the above HPI section.  All other review of systems was otherwise negative.  Outpatient Encounter Medications as of 08/13/2018  Medication Sig  . fentaNYL (DURAGESIC - DOSED MCG/HR) 12 MCG/HR Uses every other day  . naloxone (NARCAN) nasal spray 4 mg/0.1 mL Place 1 spray into the nose. In case needed  . oxyCODONE (ROXICODONE) 15 MG immediate release tablet Take by mouth 3 (three) times daily.  . pantoprazole (PROTONIX) 20 MG tablet Take 1 tablet (20 mg total) by mouth 2 (two) times  daily.  . simvastatin (ZOCOR) 40 MG tablet Take by mouth daily.  . [DISCONTINUED] pantoprazole (PROTONIX) 20 MG tablet Take 20 mg by mouth 2 (two) times daily.   . ondansetron (ZOFRAN ODT) 4 MG disintegrating tablet Dissolve 1 tablet on the tongue every 6 hours as needed for nausea.   No facility-administered encounter medications on file as of 08/13/2018.    Allergies  Allergen Reactions  . Morphine Itching   There are no active problems to display for this patient.  Social History   Socioeconomic History  . Marital  status: Married    Spouse name: [name]  . Number of children: 0  . Years of education: Not on file  . Highest education level: Not on file  Occupational History  . Occupation: Retired   Scientific laboratory technician  . Financial resource strain: Not on file  . Food insecurity:    Worry: Not on file    Inability: Not on file  . Transportation needs:    Medical: Not on file    Non-medical: Not on file  Tobacco Use  . Smoking status: Never Smoker  . Smokeless tobacco: Never Used  Substance and Sexual Activity  . Alcohol use: Not Currently  . Drug use: Never  . Sexual activity: Not on file  Lifestyle  . Physical activity:    Days per week: Not on file    Minutes per session: Not on file  . Stress: Not on file  Relationships  . Social connections:    Talks on phone: Not on file    Gets together: Not on file    Attends religious service: Not on file    Active member of club or organization: Not on file    Attends meetings of clubs or organizations: Not on file    Relationship status: Not on file  . Intimate partner violence:    Fear of current or ex partner: Not on file    Emotionally abused: Not on file    Physically abused: Not on file    Forced sexual activity: Not on file  Other Topics Concern  . Not on file  Social History Narrative  . Not on file    Mr. Ducey family history includes Breast cancer in his sister; Colon cancer in his sister; Diabetes in his brother and brother; Esophageal cancer in his brother.      Objective:    Vitals:   08/13/18 1044  BP: 118/68  Pulse: 76    Physical Exam; well-developed older white male in no acute distress, accompanied by his wife, both pleasant.  Height 6 foot, weight 185, BMI 25.0.  HEENT; nontraumatic normocephalic EOMI PERRLA sclera anicteric oral mucosa moist, Cardiovascular ;regular rate and rhythm with S1-S2 no murmur rub or gallop, Pulmonary ;clear bilaterally, Abdomen; soft, some mild tenderness in the epigastrium there is no  guarding or rebound no palpable mass or hepatosplenomegaly bowel sounds present, Rectal ;exam not done, Extremities; no clubbing cyanosis or edema skin warm and dry, Neuropsych ;alert and oriented, grossly nonfocal mood and affect appropriate       Assessment & Plan:   #75 68 year old white male with 1 week history of significant burning epigastric pain and intermittent nausea despite chronic low-dose PPI. Patient has prior history of distal esophagitis and erosive gastritis commended on EGD in 2018. I suspect he has recurrent gastritis or peptic ulcer disease.  Prior CLO test had been negative, patient has not been on NSAIDs, question if chronic opioids contributing.  #2 family  history of colon cancer in patient's brother  There had been question about patient needing another colonoscopy due to poor prep in December 2018.  Patient was sure that he had another colonoscopy after that.  [facility] as outlined above and he did have follow-up colonoscopy in February 2019 per Dr. Lyndel Safe with finding of mild diverticulosis, no polyps.  #3 status post laparoscopic cholecystectomy August 2019 #4 elevated LFTs persisting post laparoscopic cholecystectomy.  MRCP fall 2019 with dilated common bile duct, no choledocholithiasis.  Prominent pancreatic duct and mild pancreatic atrophy. Subsequent follow-up LFTs November 2019 significant improvement.  #4 chronic pain syndrome/chronic opioids  Plan; Increase pantoprazole to 40 mg p.o. twice daily x2 weeks and if symptomatically improved then decrease to 20 mg p.o. twice daily chronically Check CBC with differential, c-Met, Patient will be scheduled for upper endoscopy with Dr. Lyndel Safe.  Procedure was discussed in detail with the patient including indications risks and benefits and he is agreeable to proceed.  Colonoscopy report from February 2019 will be scanned into epic. Patient will be due for follow-up colonoscopy February  2024 Further recommendations, and/or potential need for repeat MRCP or ERCP will be based on follow-up LFTs today.  Danyla Wattley Genia Harold PA-C 08/13/2018   Cc: Townsend Roger, MD

## 2018-08-14 ENCOUNTER — Encounter: Payer: Self-pay | Admitting: Gastroenterology

## 2018-08-14 ENCOUNTER — Ambulatory Visit (AMBULATORY_SURGERY_CENTER): Payer: Medicare Other | Admitting: Gastroenterology

## 2018-08-14 VITALS — BP 104/76 | HR 66 | Temp 97.1°F | Resp 12 | Ht 72.0 in | Wt 185.0 lb

## 2018-08-14 DIAGNOSIS — K3189 Other diseases of stomach and duodenum: Secondary | ICD-10-CM | POA: Diagnosis not present

## 2018-08-14 DIAGNOSIS — K2970 Gastritis, unspecified, without bleeding: Secondary | ICD-10-CM

## 2018-08-14 DIAGNOSIS — K219 Gastro-esophageal reflux disease without esophagitis: Secondary | ICD-10-CM | POA: Diagnosis not present

## 2018-08-14 DIAGNOSIS — R1013 Epigastric pain: Secondary | ICD-10-CM | POA: Diagnosis not present

## 2018-08-14 MED ORDER — SODIUM CHLORIDE 0.9 % IV SOLN: 500.0000 mL | Freq: Once | INTRAVENOUS | Status: DC

## 2018-08-14 NOTE — Progress Notes (Signed)
Called to room to assist during endoscopic procedure.  Patient ID and intended procedure confirmed with present staff. Received instructions for my participation in the procedure from the performing physician.  

## 2018-08-14 NOTE — Progress Notes (Signed)
Thanks for taking care of him Agree with the plan Had EGD-some retained food, mild gastritis

## 2018-08-14 NOTE — Progress Notes (Signed)
Report given to PACU, vss 

## 2018-08-14 NOTE — Patient Instructions (Signed)
Biopsies taken today. Result letter in your mail in 2-3 weeks. Handout given on Gastritis.  Take Protonix 40 mg by mouth twice daily for 2 weeks,then can reduce it to once a day.  Return for office visit in 12 weeks to see Dr.Gupta.  Thank you!   YOU HAD AN ENDOSCOPIC PROCEDURE TODAY AT THE [facility]:   Refer to the procedure report that was given to you for any specific questions about what was found during the examination.  If the procedure report does not answer your questions, please call your gastroenterologist to clarify.  If you requested that your care partner not be given the details of your procedure findings, then the procedure report has been included in a sealed envelope for you to review at your convenience later.  YOU SHOULD EXPECT: Some feelings of bloating in the abdomen. Passage of more gas than usual.  Walking can help get rid of the air that was put into your GI tract during the procedure and reduce the bloating. If you had a lower endoscopy (such as a colonoscopy or flexible sigmoidoscopy) you may notice spotting of blood in your stool or on the toilet paper. If you underwent a bowel prep for your procedure, you may not have a normal bowel movement for a few days.  Please Note:  You might notice some irritation and congestion in your nose or some drainage.  This is from the oxygen used during your procedure.  There is no need for concern and it should clear up in a day or so.  SYMPTOMS TO REPORT IMMEDIATELY:    Following upper endoscopy (EGD)  Vomiting of blood or coffee ground material  New chest pain or pain under the shoulder blades  Painful or persistently difficult swallowing  New shortness of breath  Fever of 100F or higher  Black, tarry-looking stools  For urgent or emergent issues, a gastroenterologist can be reached at any hour by calling [phone].   DIET:  We do recommend a small meal at first, but then you may proceed to your regular  diet.  Drink plenty of fluids but you should avoid alcoholic beverages for 24 hours.  ACTIVITY:  You should plan to take it easy for the rest of today and you should NOT DRIVE or use heavy machinery until tomorrow (because of the sedation medicines used during the test).    FOLLOW UP: Our staff will call the number listed on your records the next business day following your procedure to check on you and address any questions or concerns that you may have regarding the information given to you following your procedure. If we do not reach you, we will leave a message.  However, if you are feeling well and you are not experiencing any problems, there is no need to return our call.  We will assume that you have returned to your regular daily activities without incident.  If any biopsies were taken you will be contacted by phone or by letter within the next 1-3 weeks.  Please call us at [phone] if you have not heard about the biopsies in 3 weeks.    SIGNATURES/CONFIDENTIALITY: You and/or your care partner have signed paperwork which will be entered into your electronic medical record.  These signatures attest to the fact that that the information above on your After Visit Summary has been reviewed and is understood.  Full responsibility of the confidentiality of this discharge information lies with you and/or your care-partner.

## 2018-08-14 NOTE — Progress Notes (Signed)
Pt's states no medical or surgical changes since  office visit. 

## 2018-08-14 NOTE — Op Note (Signed)
[facility] Patient Name: Gary Larson Procedure Date: 08/14/2018 11:08 AM MRN: [id number] Endoscopist: Lynann Bologna , MD Age: 68 Referring MD:  Date of Birth: 11/03/1950 Gender: Male Account #: [id number] Procedure:                Upper GI endoscopy Indications:              Epigastric abdominal pain. Normal CBC and CMP. Medicines:                Monitored Anesthesia Care Procedure:                Pre-Anesthesia Assessment:                           - Prior to the procedure, a History and Physical                            was performed, and patient medications and                            allergies were reviewed. The patient's tolerance of                            previous anesthesia was also reviewed. The risks                            and benefits of the procedure and the sedation                            options and risks were discussed with the patient.                            All questions were answered, and informed consent                            was obtained. Prior Anticoagulants: The patient has                            taken no previous anticoagulant or antiplatelet                            agents. ASA Grade Assessment: II - A patient with                            mild systemic disease. After reviewing the risks                            and benefits, the patient was deemed in                            satisfactory condition to undergo the procedure.                           After obtaining informed consent, the endoscope was  passed under direct vision. Throughout the                            procedure, the patient's blood pressure, pulse, and                            oxygen saturations were monitored continuously. The                            Model GIF-[id number](SN#[id number]) scope was introduced                            through the mouth, and advanced to the second part                            of duodenum.  The upper GI endoscopy was                            accomplished without difficulty. The patient                            tolerated the procedure well. Scope In: Scope Out: Findings:                 The Z-line was very mildly irregular and was found                            42 cm from the incisors. Biopsies were taken with a                            cold forceps from all 4 quadrants directed by NBI.                            Estimated blood loss: none.                           Some retained food in the stomach without outlet                            obstruction.                           Localized mild inflammation characterized by                            erythema was found in the gastric antrum. Biopsies                            were taken with a cold forceps for histology.                            Estimated blood loss: none.                           The examined duodenum was normal. Biopsies  for                            histology were taken with a cold forceps for                            evaluation of celiac disease. Complications:            No immediate complications. Estimated Blood Loss:     Estimated blood loss: none. Impression:               -Mildly irregular Z line. Biopsied.                           -Mild gastritis.                           -Some retained food in the stomach without outlet                            obstruction. Recommendation:           - Patient has a contact number available for                            emergencies. The signs and symptoms of potential                            delayed complications were discussed with the                            patient. Return to normal activities tomorrow.                            Written discharge instructions were provided to the                            patient.                           - Resume previous diet.                           - Continue Protonix 40 mg p.o. twice daily x  2                            weeks, then can reduce it to once a day.                           - Minimize pain medications.                           - Await pathology results.                           - Return to GI clinic in 12 weeks. If still with  problems, would proceed with solid-phase gastric                            emptying scan. Lynann Bologna, MD 08/14/2018 11:32:29 AM This report has been signed electronically.

## 2018-08-15 ENCOUNTER — Telehealth: Payer: Self-pay

## 2018-08-15 ENCOUNTER — Encounter

## 2018-08-15 ENCOUNTER — Ambulatory Visit: Payer: Medicare Other | Admitting: Gastroenterology

## 2018-08-15 DIAGNOSIS — K208 Other esophagitis: Secondary | ICD-10-CM | POA: Diagnosis not present

## 2018-08-15 DIAGNOSIS — K3189 Other diseases of stomach and duodenum: Secondary | ICD-10-CM | POA: Diagnosis not present

## 2018-08-15 NOTE — Addendum Note (Signed)
Addended by: Karl Bales B on: 08/15/2018 02:26 PM   Modules accepted: Orders

## 2018-08-15 NOTE — Telephone Encounter (Signed)
  Follow up Call-  Call back number 08/14/2018  Post procedure Call Back phone  # [phone]  Permission to leave phone message Yes     Patient questions:  Do you have a fever, pain , or abdominal swelling? No. Pain Score  0 *  Have you tolerated food without any problems? Yes.    Have you been able to return to your normal activities? Yes.    Do you have any questions about your discharge instructions: Diet   No. Medications  No. Follow up visit  No.  Do you have questions or concerns about your Care? No.  Actions: * If pain score is 4 or above: No action needed, pain <4.

## 2018-08-16 NOTE — Telephone Encounter (Signed)
See note from 08-13-18

## 2018-08-20 ENCOUNTER — Encounter: Payer: Self-pay | Admitting: Gastroenterology

## 2018-08-21 DIAGNOSIS — M5440 Lumbago with sciatica, unspecified side: Secondary | ICD-10-CM | POA: Diagnosis not present

## 2018-08-21 DIAGNOSIS — R011 Cardiac murmur, unspecified: Secondary | ICD-10-CM | POA: Diagnosis not present

## 2018-08-21 DIAGNOSIS — R55 Syncope and collapse: Secondary | ICD-10-CM | POA: Diagnosis not present

## 2018-08-23 DIAGNOSIS — M4317 Spondylolisthesis, lumbosacral region: Secondary | ICD-10-CM | POA: Diagnosis present

## 2018-08-23 DIAGNOSIS — R740 Nonspecific elevation of levels of transaminase and lactic acid dehydrogenase [LDH]: Secondary | ICD-10-CM | POA: Diagnosis not present

## 2018-08-23 DIAGNOSIS — M4807 Spinal stenosis, lumbosacral region: Secondary | ICD-10-CM | POA: Diagnosis not present

## 2018-08-23 DIAGNOSIS — M5417 Radiculopathy, lumbosacral region: Secondary | ICD-10-CM | POA: Diagnosis present

## 2018-08-23 DIAGNOSIS — M48061 Spinal stenosis, lumbar region without neurogenic claudication: Secondary | ICD-10-CM | POA: Diagnosis present

## 2018-08-23 DIAGNOSIS — M4316 Spondylolisthesis, lumbar region: Secondary | ICD-10-CM | POA: Diagnosis not present

## 2018-08-24 DIAGNOSIS — M4310 Spondylolisthesis, site unspecified: Secondary | ICD-10-CM | POA: Insufficient documentation

## 2018-08-24 HISTORY — DX: Spondylolisthesis, site unspecified: M43.10

## 2018-09-09 DIAGNOSIS — Z4802 Encounter for removal of sutures: Secondary | ICD-10-CM | POA: Diagnosis not present

## 2018-09-30 DIAGNOSIS — Z79899 Other long term (current) drug therapy: Secondary | ICD-10-CM | POA: Diagnosis not present

## 2018-09-30 DIAGNOSIS — G8929 Other chronic pain: Secondary | ICD-10-CM | POA: Diagnosis not present

## 2018-09-30 DIAGNOSIS — M19012 Primary osteoarthritis, left shoulder: Secondary | ICD-10-CM | POA: Diagnosis not present

## 2018-09-30 DIAGNOSIS — M545 Low back pain: Secondary | ICD-10-CM | POA: Diagnosis not present

## 2018-10-25 DIAGNOSIS — M19012 Primary osteoarthritis, left shoulder: Secondary | ICD-10-CM | POA: Diagnosis not present

## 2018-10-25 DIAGNOSIS — M545 Low back pain: Secondary | ICD-10-CM | POA: Diagnosis not present

## 2018-10-25 DIAGNOSIS — Z79899 Other long term (current) drug therapy: Secondary | ICD-10-CM | POA: Diagnosis not present

## 2018-10-25 DIAGNOSIS — G8929 Other chronic pain: Secondary | ICD-10-CM | POA: Diagnosis not present

## 2018-11-14 DIAGNOSIS — M4326 Fusion of spine, lumbar region: Secondary | ICD-10-CM | POA: Diagnosis not present

## 2018-11-25 DIAGNOSIS — G894 Chronic pain syndrome: Secondary | ICD-10-CM | POA: Diagnosis not present

## 2018-11-26 DIAGNOSIS — M19012 Primary osteoarthritis, left shoulder: Secondary | ICD-10-CM | POA: Diagnosis not present

## 2018-11-26 DIAGNOSIS — G8929 Other chronic pain: Secondary | ICD-10-CM | POA: Diagnosis not present

## 2018-11-26 DIAGNOSIS — M545 Low back pain: Secondary | ICD-10-CM | POA: Diagnosis not present

## 2018-11-26 DIAGNOSIS — Z79899 Other long term (current) drug therapy: Secondary | ICD-10-CM | POA: Diagnosis not present

## 2018-12-19 DIAGNOSIS — M4326 Fusion of spine, lumbar region: Secondary | ICD-10-CM | POA: Diagnosis not present

## 2018-12-19 DIAGNOSIS — M47816 Spondylosis without myelopathy or radiculopathy, lumbar region: Secondary | ICD-10-CM | POA: Diagnosis not present

## 2018-12-19 DIAGNOSIS — Z981 Arthrodesis status: Secondary | ICD-10-CM | POA: Diagnosis not present

## 2018-12-25 DIAGNOSIS — E7800 Pure hypercholesterolemia, unspecified: Secondary | ICD-10-CM | POA: Diagnosis not present

## 2018-12-25 DIAGNOSIS — M19012 Primary osteoarthritis, left shoulder: Secondary | ICD-10-CM | POA: Diagnosis not present

## 2018-12-25 DIAGNOSIS — Z79899 Other long term (current) drug therapy: Secondary | ICD-10-CM | POA: Diagnosis not present

## 2018-12-25 DIAGNOSIS — Z1159 Encounter for screening for other viral diseases: Secondary | ICD-10-CM | POA: Diagnosis not present

## 2019-01-16 IMAGING — MR MR CERVICAL SPINE W/O CM
6 of 17 series · 17 of 48 positions shown · non-contrast
Comparison: CT myelogram lumbar spine 11/13/2011, lumbar MRI
02/07/2011

CLINICAL DATA: Neck pain and back pain.  Negative for back surgery.

EXAM:
MRI CERVICAL, THORACIC AND LUMBAR SPINE WITHOUT CONTRAST
TECHNIQUE: Multiplanar and multiecho pulse sequences of the cervical spine, to
include the craniocervical junction and cervicothoracic junction,
and thoracic and lumbar spine, were obtained without intravenous
contrast.

[Series 7: T2 · sagittal · 3.0mm · 0.55mm/px · 2 of 15 slices shown (1 of 6)]
[im 1/15]
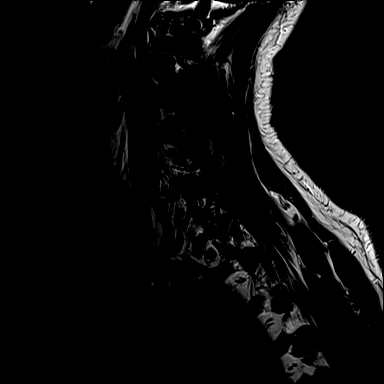
[im 15/15]
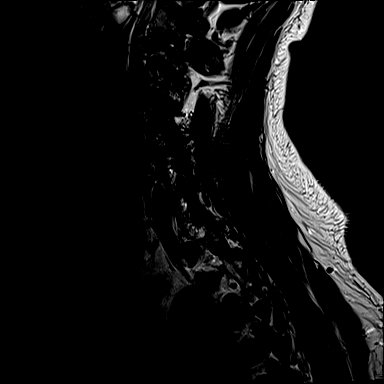

[Series 9: T2 · axial · 3.0mm · 0.50mm/px · z∈[-15,+92]mm · 4 of 34 slices shown (2 of 6)]
[im 1/34]
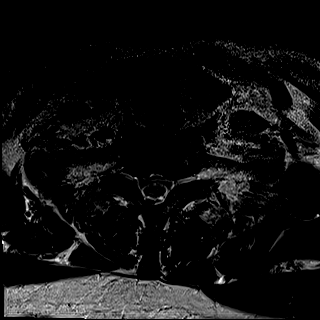
[im 12/34]
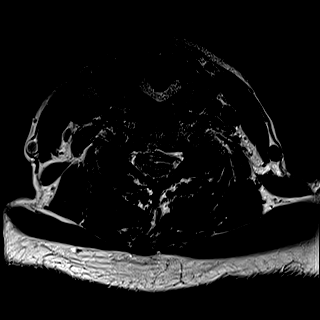
[im 23/34]
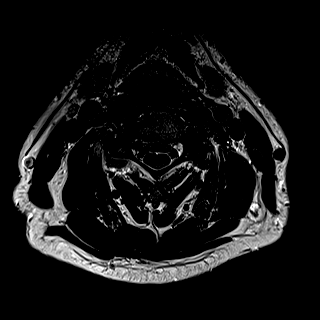
[im 34/34]
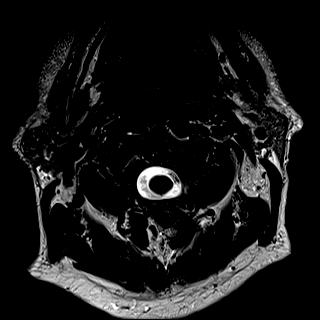

[Series 28: T2 · sagittal · 3.0mm · 0.71mm/px · 2 of 15 slices shown (3 of 6)]
[im 1/15]
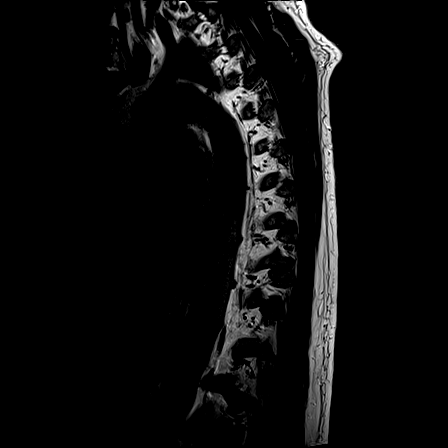
[im 15/15]
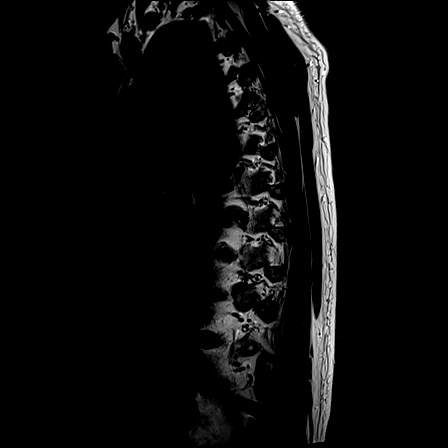

[Series 30: T2 · axial · 4.0mm · 0.28mm/px · z∈[-311,-48]mm · 4 of 36 slices shown (4 of 6)]
[im 1/36]
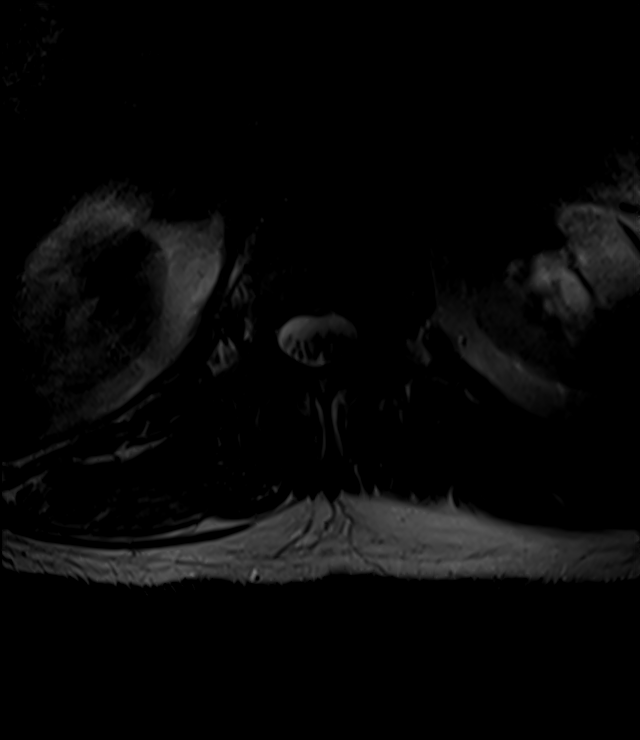
[im 12/36]
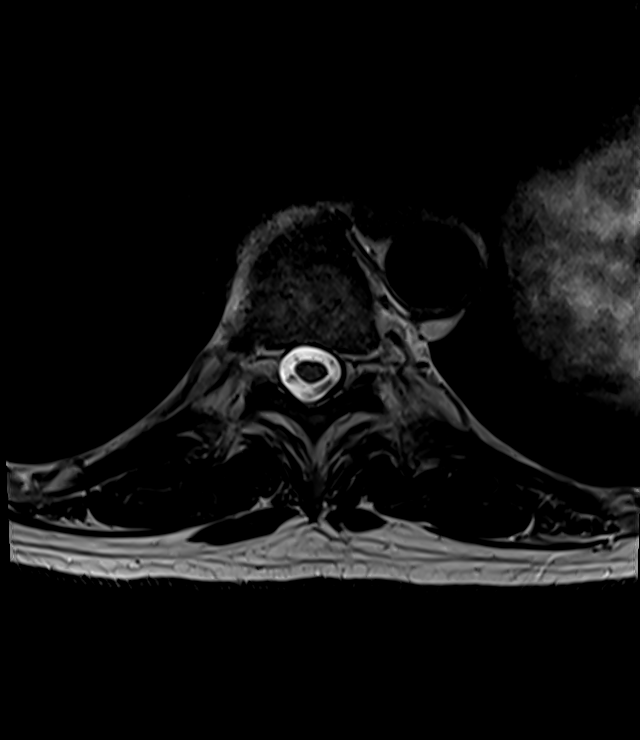
[im 24/36]
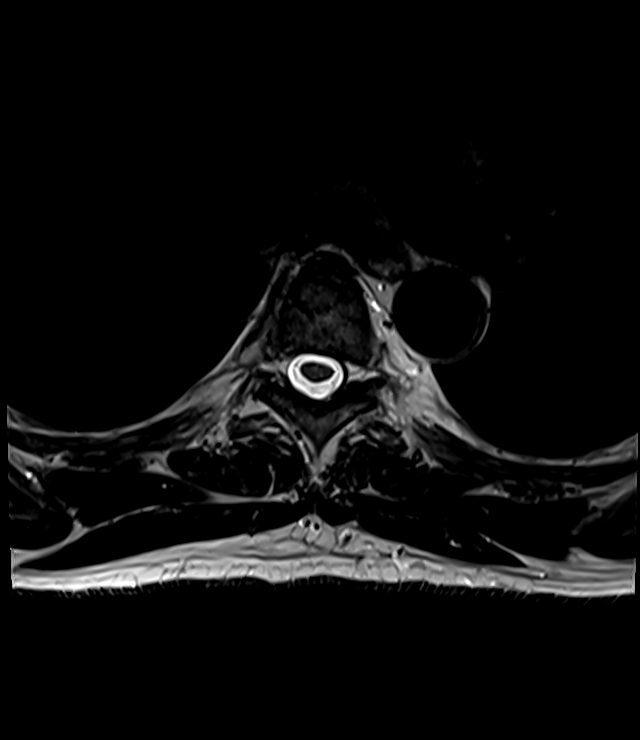
[im 36/36]
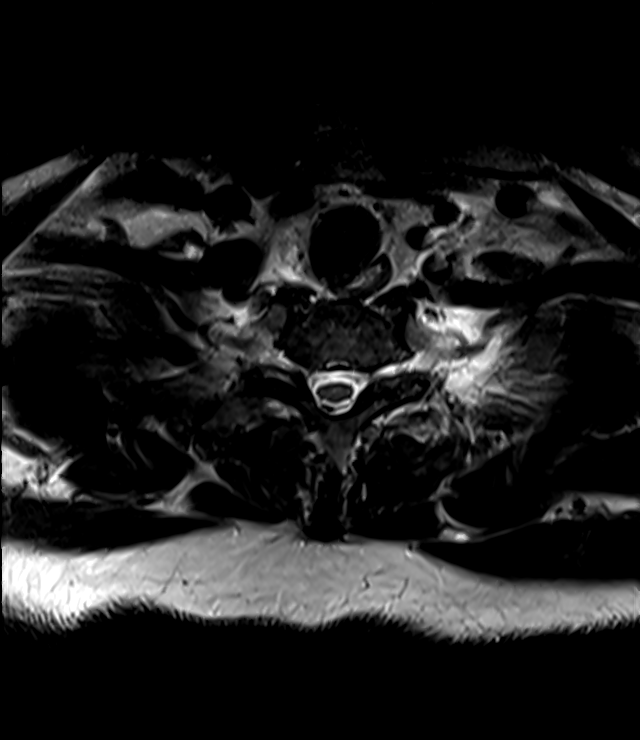

[Series 38: T2 · sagittal · 4.0mm · 0.73mm/px · 2 of 18 slices shown (5 of 6)]
[im 1/18]
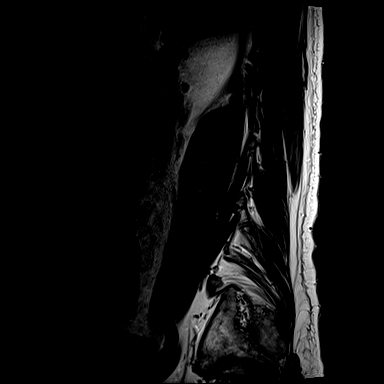
[im 18/18]
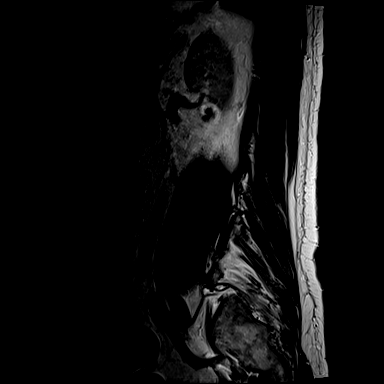

[Series 46: T2 · axial · 4.0mm · 0.28mm/px · z∈[-167,-77]mm · 3 of 37 slices shown (6 of 6)]
[im 1/37]
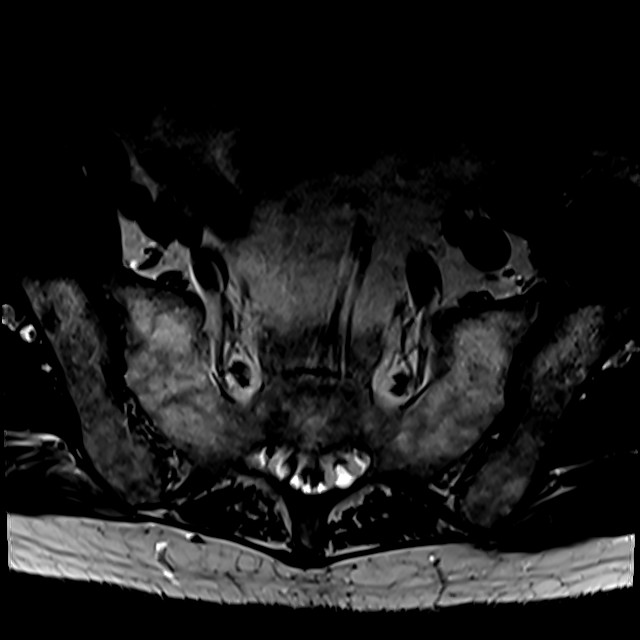
[im 10/37]
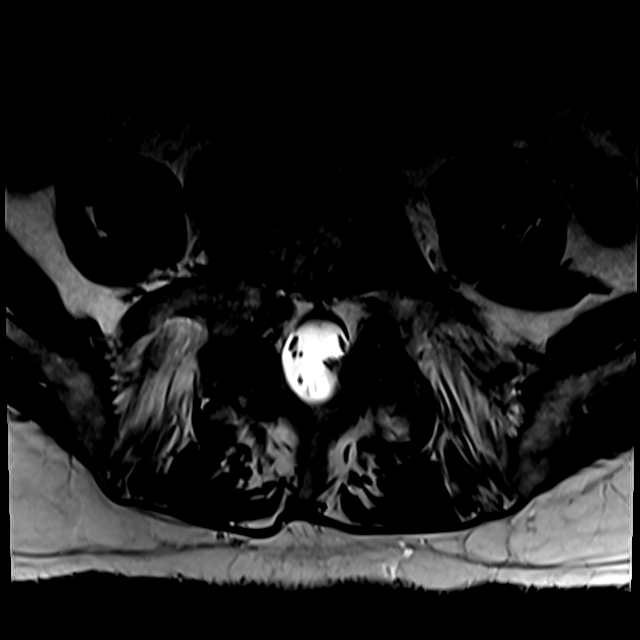
[im 19/37]
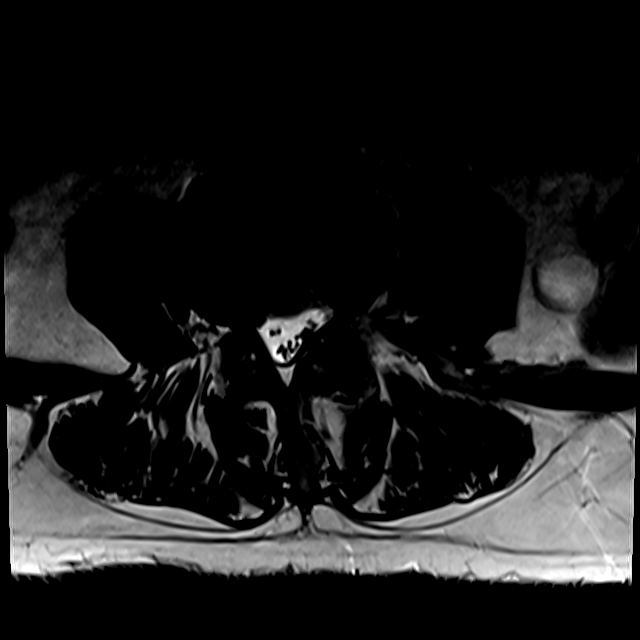

[17 of 48 positions shown; findings below may reference images not displayed]

FINDINGS: MRI CERVICAL SPINE FINDINGS

Alignment: Mild retrolisthesis C3-4. Mild anterolisthesis C7-T1 and
T1-2

Vertebrae: Negative for fracture or mass. Discogenic changes at C3-4
and C6-7

Cord: Normal signal and morphology.

Posterior Fossa, vertebral arteries, paraspinal tissues: Negative

Disc levels:

C2-3:  Mild facet degeneration on the right.  Negative for stenosis

C3-4: 3 mm retrolisthesis. Disc degeneration and spurring. Moderate
foraminal narrowing bilaterally. Mild spinal stenosis.

C4-5: Small central disc protrusion. Right-sided facet degeneration.
No significant stenosis.

C5-6: Disc degeneration with spondylosis. Diffuse uncinate spurring
and mild facet degeneration. Mild spinal stenosis and mild foraminal
stenosis bilaterally.

C6-7: Disc degeneration with moderate uncinate spurring. Mild spinal
stenosis and moderate foraminal stenosis bilaterally.

C7-T1: Mild anterior slip. Bilateral facet degeneration. No
significant stenosis.

MRI THORACIC SPINE FINDINGS

Alignment:  Mild anterior slip T1-2, T3-4, T4-5

Vertebrae: Negative for fracture or mass.  Normal bone marrow.

Cord:  Normal signal and morphology.  No cord compression.

Paraspinal and other soft tissues: Negative

Disc levels:

Mild thoracic degenerative change. No significant spinal stenosis or
cord compression.

Small right-sided disc protrusion at T2-3. Disc degeneration and
spurring at T11-12.

MRI LUMBAR SPINE FINDINGS

Segmentation:  Normal

Alignment: Straightening of the lumbar lordosis. Mild anterior slip
L1-2. Mild retrolisthesis L2-3, L3-4, L4-5. 6 mm anterolisthesis
L5-S1

Vertebrae: Negative for fracture or mass lesion. Mild
dextroscoliosis at L2-3.

Conus medullaris: Extends to the mid L1 level and appears normal.

Paraspinal and other soft tissues: Paraspinous muscles symmetric. No
retroperitoneal mass or adenopathy.

Disc levels:

L1-2:  Disc bulging and spurring without significant spinal stenosis

L2-3: Disc degeneration and mild spurring. No significant stenosis.

L3-4: Disc degeneration with disc bulging and mild endplate
spurring. Mild facet degeneration. No significant stenosis.

L4-5: Disc degeneration and spurring on the right with subarticular
stenosis on the right. Bilateral mild facet degeneration. Mild right
foraminal narrowing.

L5-S1: 6 mm anterolisthesis with bilateral pars defects L5. Marked
right foraminal encroachment with compression of the right L5 nerve
root. Left foramen patent. Findings similar to prior outside imaging
studies
IMPRESSION: Cervical disc and facet degeneration as above. Mild spinal stenosis
C3-4, C5-6, C6-7. Mild foraminal narrowing bilaterally at C5-6 and
C6-7

Mild thoracic degenerative change without significant stenosis

Multilevel lumbar degenerative change. Disc degeneration and
spurring on the right at L4-5 with mild subarticular and foraminal
narrowing

Bilateral pars defects of L5 with grade 1 anterolisthesis. Marked
right foraminal encroachment with impingement of the right L5 nerve
root.

## 2019-01-24 DIAGNOSIS — Z9189 Other specified personal risk factors, not elsewhere classified: Secondary | ICD-10-CM | POA: Diagnosis not present

## 2019-01-24 DIAGNOSIS — M19012 Primary osteoarthritis, left shoulder: Secondary | ICD-10-CM | POA: Diagnosis not present

## 2019-02-21 DIAGNOSIS — Z9189 Other specified personal risk factors, not elsewhere classified: Secondary | ICD-10-CM | POA: Diagnosis not present

## 2019-02-21 DIAGNOSIS — M19012 Primary osteoarthritis, left shoulder: Secondary | ICD-10-CM | POA: Diagnosis not present

## 2019-03-17 ENCOUNTER — Other Ambulatory Visit: Payer: Self-pay | Admitting: Physician Assistant

## 2019-03-20 ENCOUNTER — Telehealth: Payer: Self-pay | Admitting: Physician Assistant

## 2019-03-20 DIAGNOSIS — R112 Nausea with vomiting, unspecified: Secondary | ICD-10-CM

## 2019-03-20 NOTE — Telephone Encounter (Signed)
Please do Zofran 4 mg ODT every 6 to 8 hours as needed, 30, 2 refills Thx  RG

## 2019-03-20 NOTE — Telephone Encounter (Signed)
Pt would like to know why refill requested for ondansetron was denied.

## 2019-03-21 MED ORDER — ONDANSETRON 4 MG PO TBDP: ORAL_TABLET | ORAL | 2 refills | Status: DC

## 2019-03-21 NOTE — Telephone Encounter (Signed)
Called and spoke with patient's wife-Sharon-verified DRP-Sharon informed of MD recommendations; Gary Larson is agreeable with plan of care and verified patient is wanting RX to go to [place] in [place]; Gary Larson verbalized understanding of information/instructions;  Gary Larson was advised to call the office at [phone] if questions/concerns arise;

## 2019-03-24 DIAGNOSIS — M19012 Primary osteoarthritis, left shoulder: Secondary | ICD-10-CM | POA: Diagnosis not present

## 2019-05-01 DIAGNOSIS — M19012 Primary osteoarthritis, left shoulder: Secondary | ICD-10-CM | POA: Diagnosis not present

## 2019-05-01 DIAGNOSIS — Z79899 Other long term (current) drug therapy: Secondary | ICD-10-CM | POA: Diagnosis not present

## 2019-05-28 DIAGNOSIS — Z6825 Body mass index (BMI) 25.0-25.9, adult: Secondary | ICD-10-CM | POA: Diagnosis not present

## 2019-05-28 DIAGNOSIS — Z1389 Encounter for screening for other disorder: Secondary | ICD-10-CM | POA: Diagnosis not present

## 2019-05-28 DIAGNOSIS — G894 Chronic pain syndrome: Secondary | ICD-10-CM | POA: Diagnosis not present

## 2019-05-28 DIAGNOSIS — K219 Gastro-esophageal reflux disease without esophagitis: Secondary | ICD-10-CM | POA: Diagnosis not present

## 2019-05-28 DIAGNOSIS — J302 Other seasonal allergic rhinitis: Secondary | ICD-10-CM | POA: Diagnosis not present

## 2019-05-28 DIAGNOSIS — E7800 Pure hypercholesterolemia, unspecified: Secondary | ICD-10-CM | POA: Diagnosis not present

## 2019-05-28 DIAGNOSIS — R3911 Hesitancy of micturition: Secondary | ICD-10-CM | POA: Diagnosis not present

## 2019-06-02 DIAGNOSIS — M19012 Primary osteoarthritis, left shoulder: Secondary | ICD-10-CM | POA: Diagnosis not present

## 2019-06-02 DIAGNOSIS — Z79899 Other long term (current) drug therapy: Secondary | ICD-10-CM | POA: Diagnosis not present

## 2019-07-02 DIAGNOSIS — A048 Other specified bacterial intestinal infections: Secondary | ICD-10-CM | POA: Diagnosis not present

## 2019-07-02 DIAGNOSIS — Z79899 Other long term (current) drug therapy: Secondary | ICD-10-CM | POA: Diagnosis not present

## 2019-07-02 DIAGNOSIS — M19012 Primary osteoarthritis, left shoulder: Secondary | ICD-10-CM | POA: Diagnosis not present

## 2019-07-02 DIAGNOSIS — Z2821 Immunization not carried out because of patient refusal: Secondary | ICD-10-CM | POA: Diagnosis not present

## 2019-07-28 ENCOUNTER — Other Ambulatory Visit: Payer: Self-pay | Admitting: Physician Assistant

## 2019-08-07 NOTE — Telephone Encounter (Signed)
.  A user error has taken place: error

## 2019-08-09 DIAGNOSIS — Z23 Encounter for immunization: Secondary | ICD-10-CM | POA: Diagnosis not present

## 2019-09-03 DIAGNOSIS — M19012 Primary osteoarthritis, left shoulder: Secondary | ICD-10-CM | POA: Diagnosis not present

## 2019-09-03 DIAGNOSIS — Z79899 Other long term (current) drug therapy: Secondary | ICD-10-CM | POA: Diagnosis not present

## 2019-09-12 DIAGNOSIS — M5440 Lumbago with sciatica, unspecified side: Secondary | ICD-10-CM | POA: Diagnosis not present

## 2019-09-12 DIAGNOSIS — M47816 Spondylosis without myelopathy or radiculopathy, lumbar region: Secondary | ICD-10-CM | POA: Diagnosis not present

## 2019-09-12 DIAGNOSIS — M9973 Connective tissue and disc stenosis of intervertebral foramina of lumbar region: Secondary | ICD-10-CM | POA: Diagnosis not present

## 2019-09-12 DIAGNOSIS — Z981 Arthrodesis status: Secondary | ICD-10-CM | POA: Diagnosis not present

## 2019-09-12 DIAGNOSIS — M5116 Intervertebral disc disorders with radiculopathy, lumbar region: Secondary | ICD-10-CM | POA: Diagnosis not present

## 2019-09-19 ENCOUNTER — Other Ambulatory Visit: Payer: Self-pay | Admitting: Physician Assistant

## 2019-09-19 ENCOUNTER — Telehealth: Payer: Self-pay

## 2019-09-19 NOTE — Telephone Encounter (Signed)
Prior authorization started for patient's Pantoprazole through Cover My Meds.

## 2019-09-19 NOTE — Telephone Encounter (Signed)
[place]: [id number] - PA Case ID: [id number]  Outcome  Approved today  Your request has been approved  Drug Pantoprazole Sodium 20MG  dr tablets  Electronic PA Form (NCPDP)

## 2019-10-01 DIAGNOSIS — Z79899 Other long term (current) drug therapy: Secondary | ICD-10-CM | POA: Diagnosis not present

## 2019-10-01 DIAGNOSIS — M19012 Primary osteoarthritis, left shoulder: Secondary | ICD-10-CM | POA: Diagnosis not present

## 2019-10-29 DIAGNOSIS — Z79899 Other long term (current) drug therapy: Secondary | ICD-10-CM | POA: Diagnosis not present

## 2019-10-29 DIAGNOSIS — M419 Scoliosis, unspecified: Secondary | ICD-10-CM | POA: Diagnosis not present

## 2019-10-29 DIAGNOSIS — M19012 Primary osteoarthritis, left shoulder: Secondary | ICD-10-CM | POA: Diagnosis not present

## 2019-11-03 DIAGNOSIS — H25813 Combined forms of age-related cataract, bilateral: Secondary | ICD-10-CM | POA: Diagnosis not present

## 2019-11-04 DIAGNOSIS — G894 Chronic pain syndrome: Secondary | ICD-10-CM | POA: Diagnosis not present

## 2019-11-04 DIAGNOSIS — E7800 Pure hypercholesterolemia, unspecified: Secondary | ICD-10-CM | POA: Diagnosis not present

## 2019-11-24 DIAGNOSIS — H2512 Age-related nuclear cataract, left eye: Secondary | ICD-10-CM | POA: Diagnosis not present

## 2019-11-24 DIAGNOSIS — Z01818 Encounter for other preprocedural examination: Secondary | ICD-10-CM | POA: Diagnosis not present

## 2019-11-26 DIAGNOSIS — M419 Scoliosis, unspecified: Secondary | ICD-10-CM | POA: Diagnosis not present

## 2019-11-26 DIAGNOSIS — M19012 Primary osteoarthritis, left shoulder: Secondary | ICD-10-CM | POA: Diagnosis not present

## 2019-11-27 DIAGNOSIS — Z79899 Other long term (current) drug therapy: Secondary | ICD-10-CM | POA: Diagnosis not present

## 2019-12-01 DIAGNOSIS — H2511 Age-related nuclear cataract, right eye: Secondary | ICD-10-CM | POA: Diagnosis not present

## 2019-12-05 ENCOUNTER — Ambulatory Visit
Admission: RE | Admit: 2019-12-05 | Discharge: 2019-12-05 | Disposition: A | Discharge status: Home / Self Care | Payer: Medicare Other | Source: Ambulatory Visit | Attending: Internal Medicine | Admitting: Internal Medicine

## 2019-12-05 ENCOUNTER — Other Ambulatory Visit: Payer: Self-pay | Admitting: Internal Medicine

## 2019-12-05 DIAGNOSIS — R109 Unspecified abdominal pain: Secondary | ICD-10-CM

## 2019-12-05 DIAGNOSIS — N132 Hydronephrosis with renal and ureteral calculous obstruction: Secondary | ICD-10-CM | POA: Diagnosis not present

## 2019-12-08 ENCOUNTER — Other Ambulatory Visit: Payer: Self-pay | Admitting: Physician Assistant

## 2019-12-10 DIAGNOSIS — N202 Calculus of kidney with calculus of ureter: Secondary | ICD-10-CM | POA: Diagnosis not present

## 2019-12-10 DIAGNOSIS — R3121 Asymptomatic microscopic hematuria: Secondary | ICD-10-CM | POA: Diagnosis not present

## 2019-12-15 DIAGNOSIS — N200 Calculus of kidney: Secondary | ICD-10-CM | POA: Diagnosis not present

## 2019-12-16 DIAGNOSIS — N200 Calculus of kidney: Secondary | ICD-10-CM | POA: Diagnosis not present

## 2019-12-31 DIAGNOSIS — Z79899 Other long term (current) drug therapy: Secondary | ICD-10-CM | POA: Diagnosis not present

## 2019-12-31 DIAGNOSIS — M19012 Primary osteoarthritis, left shoulder: Secondary | ICD-10-CM | POA: Diagnosis not present

## 2020-01-08 DIAGNOSIS — N200 Calculus of kidney: Secondary | ICD-10-CM | POA: Diagnosis not present

## 2020-01-30 DIAGNOSIS — G4700 Insomnia, unspecified: Secondary | ICD-10-CM | POA: Diagnosis not present

## 2020-01-30 DIAGNOSIS — Z79899 Other long term (current) drug therapy: Secondary | ICD-10-CM | POA: Diagnosis not present

## 2020-01-30 DIAGNOSIS — Z9189 Other specified personal risk factors, not elsewhere classified: Secondary | ICD-10-CM | POA: Diagnosis not present

## 2020-01-30 DIAGNOSIS — M19012 Primary osteoarthritis, left shoulder: Secondary | ICD-10-CM | POA: Diagnosis not present

## 2020-03-01 DIAGNOSIS — Z79899 Other long term (current) drug therapy: Secondary | ICD-10-CM | POA: Diagnosis not present

## 2020-03-02 DIAGNOSIS — Z9189 Other specified personal risk factors, not elsewhere classified: Secondary | ICD-10-CM | POA: Diagnosis not present

## 2020-03-02 DIAGNOSIS — G4700 Insomnia, unspecified: Secondary | ICD-10-CM | POA: Diagnosis not present

## 2020-03-02 DIAGNOSIS — M19012 Primary osteoarthritis, left shoulder: Secondary | ICD-10-CM | POA: Diagnosis not present

## 2020-04-01 DIAGNOSIS — G2581 Restless legs syndrome: Secondary | ICD-10-CM | POA: Diagnosis not present

## 2020-04-01 DIAGNOSIS — G4700 Insomnia, unspecified: Secondary | ICD-10-CM | POA: Diagnosis not present

## 2020-04-01 DIAGNOSIS — M19012 Primary osteoarthritis, left shoulder: Secondary | ICD-10-CM | POA: Diagnosis not present

## 2020-04-01 DIAGNOSIS — Z9189 Other specified personal risk factors, not elsewhere classified: Secondary | ICD-10-CM | POA: Diagnosis not present

## 2020-05-04 DIAGNOSIS — Z9189 Other specified personal risk factors, not elsewhere classified: Secondary | ICD-10-CM | POA: Diagnosis not present

## 2020-05-04 DIAGNOSIS — G2581 Restless legs syndrome: Secondary | ICD-10-CM | POA: Diagnosis not present

## 2020-05-04 DIAGNOSIS — G4700 Insomnia, unspecified: Secondary | ICD-10-CM | POA: Diagnosis not present

## 2020-05-04 DIAGNOSIS — M19012 Primary osteoarthritis, left shoulder: Secondary | ICD-10-CM | POA: Diagnosis not present

## 2020-05-31 DIAGNOSIS — G894 Chronic pain syndrome: Secondary | ICD-10-CM | POA: Diagnosis not present

## 2020-05-31 DIAGNOSIS — E7800 Pure hypercholesterolemia, unspecified: Secondary | ICD-10-CM | POA: Diagnosis not present

## 2020-05-31 DIAGNOSIS — Z0000 Encounter for general adult medical examination without abnormal findings: Secondary | ICD-10-CM | POA: Diagnosis not present

## 2020-05-31 DIAGNOSIS — R3911 Hesitancy of micturition: Secondary | ICD-10-CM | POA: Diagnosis not present

## 2020-05-31 DIAGNOSIS — Z1389 Encounter for screening for other disorder: Secondary | ICD-10-CM | POA: Diagnosis not present

## 2020-05-31 DIAGNOSIS — G4700 Insomnia, unspecified: Secondary | ICD-10-CM | POA: Diagnosis not present

## 2020-05-31 DIAGNOSIS — Z6826 Body mass index (BMI) 26.0-26.9, adult: Secondary | ICD-10-CM | POA: Diagnosis not present

## 2020-05-31 DIAGNOSIS — Z131 Encounter for screening for diabetes mellitus: Secondary | ICD-10-CM | POA: Diagnosis not present

## 2020-05-31 DIAGNOSIS — M5136 Other intervertebral disc degeneration, lumbar region: Secondary | ICD-10-CM | POA: Diagnosis not present

## 2020-05-31 DIAGNOSIS — K219 Gastro-esophageal reflux disease without esophagitis: Secondary | ICD-10-CM | POA: Diagnosis not present

## 2020-06-03 DIAGNOSIS — M19012 Primary osteoarthritis, left shoulder: Secondary | ICD-10-CM | POA: Diagnosis not present

## 2020-06-03 DIAGNOSIS — G4700 Insomnia, unspecified: Secondary | ICD-10-CM | POA: Diagnosis not present

## 2020-06-03 DIAGNOSIS — Z79899 Other long term (current) drug therapy: Secondary | ICD-10-CM | POA: Diagnosis not present

## 2020-06-03 DIAGNOSIS — G2581 Restless legs syndrome: Secondary | ICD-10-CM | POA: Diagnosis not present

## 2020-06-18 DIAGNOSIS — H524 Presbyopia: Secondary | ICD-10-CM | POA: Diagnosis not present

## 2020-07-02 DIAGNOSIS — G2581 Restless legs syndrome: Secondary | ICD-10-CM | POA: Diagnosis not present

## 2020-07-02 DIAGNOSIS — Z79899 Other long term (current) drug therapy: Secondary | ICD-10-CM | POA: Diagnosis not present

## 2020-07-02 DIAGNOSIS — M19012 Primary osteoarthritis, left shoulder: Secondary | ICD-10-CM | POA: Diagnosis not present

## 2020-08-03 DIAGNOSIS — Z79899 Other long term (current) drug therapy: Secondary | ICD-10-CM | POA: Diagnosis not present

## 2020-08-03 DIAGNOSIS — M19012 Primary osteoarthritis, left shoulder: Secondary | ICD-10-CM | POA: Diagnosis not present

## 2020-08-03 DIAGNOSIS — G2581 Restless legs syndrome: Secondary | ICD-10-CM | POA: Diagnosis not present

## 2020-08-05 ENCOUNTER — Other Ambulatory Visit: Payer: Self-pay | Admitting: Physician Assistant

## 2020-09-01 DIAGNOSIS — Z79899 Other long term (current) drug therapy: Secondary | ICD-10-CM | POA: Diagnosis not present

## 2020-09-01 DIAGNOSIS — M19012 Primary osteoarthritis, left shoulder: Secondary | ICD-10-CM | POA: Diagnosis not present

## 2020-09-01 DIAGNOSIS — G2581 Restless legs syndrome: Secondary | ICD-10-CM | POA: Diagnosis not present

## 2020-09-03 DIAGNOSIS — N644 Mastodynia: Secondary | ICD-10-CM | POA: Diagnosis not present

## 2020-09-29 DIAGNOSIS — Z79899 Other long term (current) drug therapy: Secondary | ICD-10-CM | POA: Diagnosis not present

## 2020-09-29 DIAGNOSIS — M19012 Primary osteoarthritis, left shoulder: Secondary | ICD-10-CM | POA: Diagnosis not present

## 2020-09-29 DIAGNOSIS — G2581 Restless legs syndrome: Secondary | ICD-10-CM | POA: Diagnosis not present

## 2020-10-14 DIAGNOSIS — M25512 Pain in left shoulder: Secondary | ICD-10-CM | POA: Diagnosis not present

## 2020-10-22 DIAGNOSIS — M25512 Pain in left shoulder: Secondary | ICD-10-CM | POA: Diagnosis not present

## 2020-10-22 DIAGNOSIS — M19012 Primary osteoarthritis, left shoulder: Secondary | ICD-10-CM | POA: Diagnosis not present

## 2020-10-26 DIAGNOSIS — M75122 Complete rotator cuff tear or rupture of left shoulder, not specified as traumatic: Secondary | ICD-10-CM | POA: Diagnosis not present

## 2020-10-26 DIAGNOSIS — M25512 Pain in left shoulder: Secondary | ICD-10-CM | POA: Diagnosis not present

## 2020-10-26 DIAGNOSIS — M67812 Other specified disorders of synovium, left shoulder: Secondary | ICD-10-CM | POA: Diagnosis not present

## 2020-10-28 DIAGNOSIS — G2581 Restless legs syndrome: Secondary | ICD-10-CM | POA: Diagnosis not present

## 2020-10-28 DIAGNOSIS — Z79899 Other long term (current) drug therapy: Secondary | ICD-10-CM | POA: Diagnosis not present

## 2020-10-28 DIAGNOSIS — M19012 Primary osteoarthritis, left shoulder: Secondary | ICD-10-CM | POA: Diagnosis not present

## 2020-10-28 DIAGNOSIS — G4700 Insomnia, unspecified: Secondary | ICD-10-CM | POA: Diagnosis not present

## 2020-11-01 DIAGNOSIS — J310 Chronic rhinitis: Secondary | ICD-10-CM | POA: Diagnosis not present

## 2020-11-02 DIAGNOSIS — M25512 Pain in left shoulder: Secondary | ICD-10-CM | POA: Diagnosis not present

## 2020-11-02 DIAGNOSIS — G8929 Other chronic pain: Secondary | ICD-10-CM | POA: Diagnosis not present

## 2020-11-05 DIAGNOSIS — M75102 Unspecified rotator cuff tear or rupture of left shoulder, not specified as traumatic: Secondary | ICD-10-CM

## 2020-11-05 HISTORY — DX: Unspecified rotator cuff tear or rupture of left shoulder, not specified as traumatic: M75.102

## 2020-11-09 DIAGNOSIS — Z961 Presence of intraocular lens: Secondary | ICD-10-CM | POA: Diagnosis not present

## 2020-11-09 DIAGNOSIS — H43393 Other vitreous opacities, bilateral: Secondary | ICD-10-CM | POA: Diagnosis not present

## 2020-11-25 DIAGNOSIS — G2581 Restless legs syndrome: Secondary | ICD-10-CM | POA: Diagnosis not present

## 2020-11-25 DIAGNOSIS — G4700 Insomnia, unspecified: Secondary | ICD-10-CM | POA: Diagnosis not present

## 2020-11-25 DIAGNOSIS — Z79899 Other long term (current) drug therapy: Secondary | ICD-10-CM | POA: Diagnosis not present

## 2020-11-25 DIAGNOSIS — M19012 Primary osteoarthritis, left shoulder: Secondary | ICD-10-CM | POA: Diagnosis not present

## 2020-11-26 DIAGNOSIS — R799 Abnormal finding of blood chemistry, unspecified: Secondary | ICD-10-CM | POA: Diagnosis not present

## 2020-11-26 DIAGNOSIS — M75102 Unspecified rotator cuff tear or rupture of left shoulder, not specified as traumatic: Secondary | ICD-10-CM | POA: Diagnosis not present

## 2020-11-26 DIAGNOSIS — Z01818 Encounter for other preprocedural examination: Secondary | ICD-10-CM | POA: Diagnosis not present

## 2020-12-01 DIAGNOSIS — M65812 Other synovitis and tenosynovitis, left shoulder: Secondary | ICD-10-CM | POA: Diagnosis not present

## 2020-12-01 DIAGNOSIS — M75122 Complete rotator cuff tear or rupture of left shoulder, not specified as traumatic: Secondary | ICD-10-CM | POA: Diagnosis not present

## 2020-12-01 DIAGNOSIS — G8918 Other acute postprocedural pain: Secondary | ICD-10-CM | POA: Diagnosis not present

## 2020-12-01 DIAGNOSIS — M75102 Unspecified rotator cuff tear or rupture of left shoulder, not specified as traumatic: Secondary | ICD-10-CM | POA: Diagnosis not present

## 2020-12-06 DIAGNOSIS — M25512 Pain in left shoulder: Secondary | ICD-10-CM | POA: Diagnosis not present

## 2020-12-06 DIAGNOSIS — M25612 Stiffness of left shoulder, not elsewhere classified: Secondary | ICD-10-CM | POA: Diagnosis not present

## 2020-12-08 DIAGNOSIS — M25512 Pain in left shoulder: Secondary | ICD-10-CM | POA: Diagnosis not present

## 2020-12-08 DIAGNOSIS — M25612 Stiffness of left shoulder, not elsewhere classified: Secondary | ICD-10-CM | POA: Diagnosis not present

## 2020-12-14 DIAGNOSIS — M25512 Pain in left shoulder: Secondary | ICD-10-CM | POA: Diagnosis not present

## 2020-12-14 DIAGNOSIS — M25612 Stiffness of left shoulder, not elsewhere classified: Secondary | ICD-10-CM | POA: Diagnosis not present

## 2020-12-16 DIAGNOSIS — M25612 Stiffness of left shoulder, not elsewhere classified: Secondary | ICD-10-CM | POA: Diagnosis not present

## 2020-12-16 DIAGNOSIS — M25512 Pain in left shoulder: Secondary | ICD-10-CM | POA: Diagnosis not present

## 2020-12-21 DIAGNOSIS — M25512 Pain in left shoulder: Secondary | ICD-10-CM | POA: Diagnosis not present

## 2020-12-21 DIAGNOSIS — M25612 Stiffness of left shoulder, not elsewhere classified: Secondary | ICD-10-CM | POA: Diagnosis not present

## 2020-12-24 DIAGNOSIS — R5383 Other fatigue: Secondary | ICD-10-CM | POA: Diagnosis not present

## 2020-12-24 DIAGNOSIS — E7800 Pure hypercholesterolemia, unspecified: Secondary | ICD-10-CM | POA: Diagnosis not present

## 2020-12-24 DIAGNOSIS — M25612 Stiffness of left shoulder, not elsewhere classified: Secondary | ICD-10-CM | POA: Diagnosis not present

## 2020-12-24 DIAGNOSIS — G894 Chronic pain syndrome: Secondary | ICD-10-CM | POA: Diagnosis not present

## 2020-12-24 DIAGNOSIS — M25512 Pain in left shoulder: Secondary | ICD-10-CM | POA: Diagnosis not present

## 2020-12-28 DIAGNOSIS — M25612 Stiffness of left shoulder, not elsewhere classified: Secondary | ICD-10-CM | POA: Diagnosis not present

## 2020-12-28 DIAGNOSIS — M25512 Pain in left shoulder: Secondary | ICD-10-CM | POA: Diagnosis not present

## 2020-12-28 DIAGNOSIS — G2581 Restless legs syndrome: Secondary | ICD-10-CM | POA: Diagnosis not present

## 2020-12-28 DIAGNOSIS — M19012 Primary osteoarthritis, left shoulder: Secondary | ICD-10-CM | POA: Diagnosis not present

## 2020-12-28 DIAGNOSIS — Z79899 Other long term (current) drug therapy: Secondary | ICD-10-CM | POA: Diagnosis not present

## 2021-01-04 DIAGNOSIS — Z5189 Encounter for other specified aftercare: Secondary | ICD-10-CM | POA: Diagnosis not present

## 2021-01-04 DIAGNOSIS — M75102 Unspecified rotator cuff tear or rupture of left shoulder, not specified as traumatic: Secondary | ICD-10-CM | POA: Diagnosis not present

## 2021-01-06 DIAGNOSIS — M75102 Unspecified rotator cuff tear or rupture of left shoulder, not specified as traumatic: Secondary | ICD-10-CM | POA: Diagnosis not present

## 2021-01-06 DIAGNOSIS — Z5189 Encounter for other specified aftercare: Secondary | ICD-10-CM | POA: Diagnosis not present

## 2021-01-10 ENCOUNTER — Other Ambulatory Visit: Payer: Self-pay | Admitting: Physician Assistant

## 2021-01-11 DIAGNOSIS — Z5189 Encounter for other specified aftercare: Secondary | ICD-10-CM | POA: Diagnosis not present

## 2021-01-11 DIAGNOSIS — M75102 Unspecified rotator cuff tear or rupture of left shoulder, not specified as traumatic: Secondary | ICD-10-CM | POA: Diagnosis not present

## 2021-01-17 ENCOUNTER — Other Ambulatory Visit: Payer: Self-pay | Admitting: Physician Assistant

## 2021-01-27 DIAGNOSIS — Z79899 Other long term (current) drug therapy: Secondary | ICD-10-CM | POA: Diagnosis not present

## 2021-01-27 DIAGNOSIS — M19012 Primary osteoarthritis, left shoulder: Secondary | ICD-10-CM | POA: Diagnosis not present

## 2021-01-27 DIAGNOSIS — Z9189 Other specified personal risk factors, not elsewhere classified: Secondary | ICD-10-CM | POA: Diagnosis not present

## 2021-01-27 DIAGNOSIS — G2581 Restless legs syndrome: Secondary | ICD-10-CM | POA: Diagnosis not present

## 2021-02-01 DIAGNOSIS — M25512 Pain in left shoulder: Secondary | ICD-10-CM | POA: Diagnosis not present

## 2021-02-04 DIAGNOSIS — M25512 Pain in left shoulder: Secondary | ICD-10-CM | POA: Diagnosis not present

## 2021-02-09 DIAGNOSIS — M25512 Pain in left shoulder: Secondary | ICD-10-CM | POA: Diagnosis not present

## 2021-02-11 DIAGNOSIS — M25512 Pain in left shoulder: Secondary | ICD-10-CM | POA: Diagnosis not present

## 2021-02-16 DIAGNOSIS — M25512 Pain in left shoulder: Secondary | ICD-10-CM | POA: Diagnosis not present

## 2021-02-18 DIAGNOSIS — M25512 Pain in left shoulder: Secondary | ICD-10-CM | POA: Diagnosis not present

## 2021-02-23 DIAGNOSIS — M25512 Pain in left shoulder: Secondary | ICD-10-CM | POA: Diagnosis not present

## 2021-02-25 DIAGNOSIS — M25512 Pain in left shoulder: Secondary | ICD-10-CM | POA: Diagnosis not present

## 2021-03-08 DIAGNOSIS — Z79899 Other long term (current) drug therapy: Secondary | ICD-10-CM | POA: Diagnosis not present

## 2021-03-08 DIAGNOSIS — M19012 Primary osteoarthritis, left shoulder: Secondary | ICD-10-CM | POA: Diagnosis not present

## 2021-03-08 DIAGNOSIS — G2581 Restless legs syndrome: Secondary | ICD-10-CM | POA: Diagnosis not present

## 2021-03-10 DIAGNOSIS — M25512 Pain in left shoulder: Secondary | ICD-10-CM | POA: Diagnosis not present

## 2021-03-10 DIAGNOSIS — Z79899 Other long term (current) drug therapy: Secondary | ICD-10-CM | POA: Diagnosis not present

## 2021-03-10 DIAGNOSIS — M25612 Stiffness of left shoulder, not elsewhere classified: Secondary | ICD-10-CM | POA: Diagnosis not present

## 2021-03-15 DIAGNOSIS — M25512 Pain in left shoulder: Secondary | ICD-10-CM | POA: Diagnosis not present

## 2021-03-15 DIAGNOSIS — M25612 Stiffness of left shoulder, not elsewhere classified: Secondary | ICD-10-CM | POA: Diagnosis not present

## 2021-03-17 DIAGNOSIS — M25512 Pain in left shoulder: Secondary | ICD-10-CM | POA: Diagnosis not present

## 2021-03-17 DIAGNOSIS — M25612 Stiffness of left shoulder, not elsewhere classified: Secondary | ICD-10-CM | POA: Diagnosis not present

## 2021-03-22 DIAGNOSIS — M25612 Stiffness of left shoulder, not elsewhere classified: Secondary | ICD-10-CM | POA: Diagnosis not present

## 2021-03-22 DIAGNOSIS — M25512 Pain in left shoulder: Secondary | ICD-10-CM | POA: Diagnosis not present

## 2021-03-24 DIAGNOSIS — M25612 Stiffness of left shoulder, not elsewhere classified: Secondary | ICD-10-CM | POA: Diagnosis not present

## 2021-03-24 DIAGNOSIS — M25512 Pain in left shoulder: Secondary | ICD-10-CM | POA: Diagnosis not present

## 2021-03-29 DIAGNOSIS — M25512 Pain in left shoulder: Secondary | ICD-10-CM | POA: Diagnosis not present

## 2021-03-29 DIAGNOSIS — M25612 Stiffness of left shoulder, not elsewhere classified: Secondary | ICD-10-CM | POA: Diagnosis not present

## 2021-03-31 DIAGNOSIS — M25612 Stiffness of left shoulder, not elsewhere classified: Secondary | ICD-10-CM | POA: Diagnosis not present

## 2021-03-31 DIAGNOSIS — M25512 Pain in left shoulder: Secondary | ICD-10-CM | POA: Diagnosis not present

## 2021-04-12 DIAGNOSIS — M75102 Unspecified rotator cuff tear or rupture of left shoulder, not specified as traumatic: Secondary | ICD-10-CM | POA: Diagnosis not present

## 2021-04-18 DIAGNOSIS — G2581 Restless legs syndrome: Secondary | ICD-10-CM | POA: Diagnosis not present

## 2021-04-18 DIAGNOSIS — Z79899 Other long term (current) drug therapy: Secondary | ICD-10-CM | POA: Diagnosis not present

## 2021-04-18 DIAGNOSIS — M19012 Primary osteoarthritis, left shoulder: Secondary | ICD-10-CM | POA: Diagnosis not present

## 2021-04-22 DIAGNOSIS — Z79899 Other long term (current) drug therapy: Secondary | ICD-10-CM | POA: Diagnosis not present

## 2021-05-05 DIAGNOSIS — M75102 Unspecified rotator cuff tear or rupture of left shoulder, not specified as traumatic: Secondary | ICD-10-CM | POA: Diagnosis not present

## 2021-05-05 DIAGNOSIS — M75122 Complete rotator cuff tear or rupture of left shoulder, not specified as traumatic: Secondary | ICD-10-CM | POA: Diagnosis not present

## 2021-05-09 ENCOUNTER — Encounter: Payer: Self-pay | Admitting: Gastroenterology

## 2021-05-09 ENCOUNTER — Ambulatory Visit (INDEPENDENT_AMBULATORY_CARE_PROVIDER_SITE_OTHER): Payer: Medicare Other | Admitting: Gastroenterology

## 2021-05-09 ENCOUNTER — Other Ambulatory Visit: Payer: Self-pay

## 2021-05-09 VITALS — BP 118/78 | HR 80 | Ht 72.0 in | Wt 195.4 lb

## 2021-05-09 DIAGNOSIS — R1010 Upper abdominal pain, unspecified: Secondary | ICD-10-CM

## 2021-05-09 DIAGNOSIS — Z800 Family history of malignant neoplasm of digestive organs: Secondary | ICD-10-CM | POA: Diagnosis not present

## 2021-05-09 MED ORDER — PANTOPRAZOLE SODIUM 20 MG PO TBEC: 20.0000 mg | DELAYED_RELEASE_TABLET | Freq: Two times a day (BID) | ORAL | 3 refills | Status: DC

## 2021-05-09 NOTE — Progress Notes (Signed)
Chief Complaint: FU  Referring Provider:  Leonia Reader, Barbara Cower, MD      ASSESSMENT AND PLAN;   #1. Gen abdo pain with bloating. H/O chronic constipation likely OIC.  #2. GERD  #3. FH of colon cancer (brother at age 69), H/O colonic polyps.  Neg colon 08/2017. Rpt in 5 yrs.  Plan: - Miralax 17g po bid until good BM, then QD - If still with problems, CT AP with contrast - Continue protonix 20 BID - Pt will get in touch with Dr Leonia Reader regarding ref to ENT. RE: Post nasal drip - FU in 6 months.  Earlier, if still with problems. - Discussed with patient and patient's wife.  HPI:    Gary Larson is a 70 y.o. male  With chronic back pain on narcotics, GERD, OA, anxiety/depression, HLD  With generalized abdominal discomfort with bloating and constipation.  He has taken Ex-Lax with some relief.  He has felt somewhat better.  MiraLAX works the best which he takes on as needed basis only.  He denies having any melena or hematochezia.  Had negative colonoscopy in 2019 as below.  Also has been having post nasal drip with resultant cough  No heartburn as long as he takes Protonix 20 mg p.o. twice daily.  No weight loss or loss of appetite otherwise.   Past GI work-up:  CT AP 12/05/2019 1. Multiple bilateral renal calculi. 2. Moderate left hydronephrosis and hydroureter. 6 mm calculus in the left ladder appears to have passed in the bladder, versus in the distal left UVJ. Bladder wall thickening.  EGD 08/04/2018 -Mildly irregular Z line. Biopsied. -Mild gastritis. -Some retained food in the stomach without outlet obstruction.  Colon 09/17/2017 after 2 day prep -Mild pancolonic diverticulosis -Otherwise normal colonoscopy -Repeat in 5 years due to family history  -Elevated LFTs (resolved). S/P Lap chole d/t acalculous cholecystitis 03/15/2018, neg MRCP 06/02/2018 for CBD stones (did show increased CBD dilatation from 11 to 13 mm, mild IHBR dilatation, normal PD), neg acute panel  03/2015.   Past Medical History:  Diagnosis Date   Arthritis    Elevated cholesterol    Gastritis    Kidney stones     Past Surgical History:  Procedure Laterality Date   CARPAL TUNNEL RELEASE Right    CHOLECYSTECTOMY  03/14/2018   COLONOSCOPY WITH ESOPHAGOGASTRODUODENOSCOPY (EGD)  2019   SHOULDER SURGERY Right    4 times resected     Family History  Problem Relation Age of Onset   Breast cancer Sister    Colon cancer Sister    Diabetes Brother    Esophageal cancer Brother    Diabetes Brother    Stomach cancer Brother    Colon polyps Neg Hx    Rectal cancer Neg Hx     Social History   Tobacco Use   Smoking status: Never   Smokeless tobacco: Never  Vaping Use   Vaping Use: Never used  Substance Use Topics   Alcohol use: Not Currently   Drug use: Never    Current Outpatient Medications  Medication Sig Dispense Refill   ALPRAZolam (XANAX XR) 0.5 MG 24 hr tablet Take 0.5 mg by mouth 3 (three) times daily as needed.     ondansetron (ZOFRAN ODT) 4 MG disintegrating tablet Dissolve 1 tablet on the tongue every 6 hours as needed for nausea. 30 tablet 2   oxyCODONE (OXY IR/ROXICODONE) 5 MG immediate release tablet Take 5 mg by mouth in the morning and at bedtime.  pantoprazole (PROTONIX) 20 MG tablet Take 1 tablet (20 mg total) by mouth 2 (two) times daily. PLEASE SCHEDULE FOLLOW UP FOR FURTHER REFILLS 60 tablet 0   simvastatin (ZOCOR) 40 MG tablet Take by mouth daily.     Current Facility-Administered Medications  Medication Dose Route Frequency Provider Last Rate Last Admin   0.9 %  sodium chloride infusion  500 mL Intravenous Once Lynann Bologna, MD        Allergies  Allergen Reactions   Morphine Itching    Review of Systems:  Psychiatric/Behavioral: Has anxiety or depression     Physical Exam:    BP 118/78   Pulse 80   Ht 6' (1.829 m)   Wt 195 lb 6 oz (88.6 kg)   SpO2 99%   BMI 26.50 kg/m  Filed Weights   05/09/21 1031  Weight: 195 lb 6 oz  (88.6 kg)   Constitutional:  Well-developed, in no acute distress. Psychiatric: Normal mood and affect. Behavior is normal. HEENT: Pupils normal.  Conjunctivae are normal. No scleral icterus. Neck supple.  Cardiovascular: Normal rate, regular rhythm. No edema Pulmonary/chest: Effort normal and breath sounds normal. No wheezing, rales or rhonchi. Abdominal: Soft, nondistended. Nontender. Bowel sounds active throughout. There are no masses palpable. No hepatomegaly. Rectal:  defered Neurological: Alert and oriented to person place and time. Skin: Skin is warm and dry. No rashes noted.    Edman Circle, MD 05/09/2021, 10:38 AM  Cc: Crist Fat, MD

## 2021-05-09 NOTE — Patient Instructions (Addendum)
If you are age 69 or older, your body mass index should be between 23-30. Your Body mass index is 26.5 kg/m. If this is out of the aforementioned range listed, please consider follow up with your Primary Care Provider.  If you are age 36 or younger, your body mass index should be between 19-25. Your Body mass index is 26.5 kg/m. If this is out of the aformentioned range listed, please consider follow up with your Primary Care Provider.   __________________________________________________________  The [facility] providers would like to encourage you to use [facility] to communicate with providers for non-urgent requests or questions.  Due to long hold times on the telephone, sending your provider a message by [facility] may be a faster and more efficient way to get a response.  Please allow 48 business hours for a response.  Please remember that this is for non-urgent requests.   Please purchase the following medications over the counter and take as directed: Miralax 17g 2 times a day until a good bowel movement and then 17g daily thereafter  Please talk to your PCP about getting an ENT referral  Continue Protonix 20mg  2 times daily. Script has been sent.   Please call in 6 months to schedule an appointment.  Thank you,  Dr. 

## 2021-05-18 DIAGNOSIS — M19012 Primary osteoarthritis, left shoulder: Secondary | ICD-10-CM | POA: Diagnosis not present

## 2021-05-18 DIAGNOSIS — G2581 Restless legs syndrome: Secondary | ICD-10-CM | POA: Diagnosis not present

## 2021-05-18 DIAGNOSIS — Z79899 Other long term (current) drug therapy: Secondary | ICD-10-CM | POA: Diagnosis not present

## 2021-06-03 DIAGNOSIS — Z125 Encounter for screening for malignant neoplasm of prostate: Secondary | ICD-10-CM | POA: Diagnosis not present

## 2021-06-03 DIAGNOSIS — R011 Cardiac murmur, unspecified: Secondary | ICD-10-CM | POA: Diagnosis not present

## 2021-06-03 DIAGNOSIS — K219 Gastro-esophageal reflux disease without esophagitis: Secondary | ICD-10-CM | POA: Diagnosis not present

## 2021-06-03 DIAGNOSIS — Z6825 Body mass index (BMI) 25.0-25.9, adult: Secondary | ICD-10-CM | POA: Diagnosis not present

## 2021-06-03 DIAGNOSIS — E7800 Pure hypercholesterolemia, unspecified: Secondary | ICD-10-CM | POA: Diagnosis not present

## 2021-06-03 DIAGNOSIS — M5136 Other intervertebral disc degeneration, lumbar region: Secondary | ICD-10-CM | POA: Diagnosis not present

## 2021-06-03 DIAGNOSIS — J302 Other seasonal allergic rhinitis: Secondary | ICD-10-CM | POA: Diagnosis not present

## 2021-06-03 DIAGNOSIS — Z0000 Encounter for general adult medical examination without abnormal findings: Secondary | ICD-10-CM | POA: Diagnosis not present

## 2021-06-03 DIAGNOSIS — Z1331 Encounter for screening for depression: Secondary | ICD-10-CM | POA: Diagnosis not present

## 2021-06-03 DIAGNOSIS — R7303 Prediabetes: Secondary | ICD-10-CM | POA: Diagnosis not present

## 2021-06-03 DIAGNOSIS — G894 Chronic pain syndrome: Secondary | ICD-10-CM | POA: Diagnosis not present

## 2021-06-03 DIAGNOSIS — Z131 Encounter for screening for diabetes mellitus: Secondary | ICD-10-CM | POA: Diagnosis not present

## 2021-06-17 DIAGNOSIS — Z79899 Other long term (current) drug therapy: Secondary | ICD-10-CM | POA: Diagnosis not present

## 2021-06-18 ENCOUNTER — Encounter: Payer: Self-pay | Admitting: Gastroenterology

## 2021-06-20 DIAGNOSIS — Z79899 Other long term (current) drug therapy: Secondary | ICD-10-CM | POA: Diagnosis not present

## 2021-06-27 ENCOUNTER — Other Ambulatory Visit: Payer: Self-pay

## 2021-06-27 DIAGNOSIS — K219 Gastro-esophageal reflux disease without esophagitis: Secondary | ICD-10-CM

## 2021-06-27 DIAGNOSIS — R110 Nausea: Secondary | ICD-10-CM

## 2021-06-27 DIAGNOSIS — R109 Unspecified abdominal pain: Secondary | ICD-10-CM

## 2021-06-27 MED ORDER — PANTOPRAZOLE SODIUM 40 MG PO TBEC: 40.0000 mg | DELAYED_RELEASE_TABLET | Freq: Two times a day (BID) | ORAL | 0 refills | Status: DC

## 2021-06-27 NOTE — Telephone Encounter (Signed)
Pt made aware of Dr. Chales Abrahams Recommendations Prescription sent to pharmacy: Pt made aware. Orders for labs placed in Epic. CMC, CMP   pt made aware. Given directions to lab. Ct Ordered for 07/08/2021 @ 10:00 @ [facility]. Pt to arrive at 9:45. Pt not to eat or drink anything prior. Pt to pick up oral contrast prior to CT scan from [facility] Radiology dept. Pt verbalized understanding with all questions answered.

## 2021-06-27 NOTE — Telephone Encounter (Signed)
Lets increase Protonix to 40 mg p.o. twice daily x 2 weeks Check CBC, CMP Proceed with CT Abdo/pelvis with p.o. and IV contrast as planned (as per last note) RG

## 2021-06-28 ENCOUNTER — Other Ambulatory Visit (INDEPENDENT_AMBULATORY_CARE_PROVIDER_SITE_OTHER): Payer: Medicare Other

## 2021-06-28 DIAGNOSIS — M75102 Unspecified rotator cuff tear or rupture of left shoulder, not specified as traumatic: Secondary | ICD-10-CM | POA: Diagnosis not present

## 2021-06-28 DIAGNOSIS — K219 Gastro-esophageal reflux disease without esophagitis: Secondary | ICD-10-CM

## 2021-06-28 DIAGNOSIS — R110 Nausea: Secondary | ICD-10-CM

## 2021-06-28 LAB — COMPREHENSIVE METABOLIC PANEL
ALT: 14 U/L (ref 0–53)
AST: 15 U/L (ref 0–37)
Albumin: 4.3 g/dL (ref 3.5–5.2)
Alkaline Phosphatase: 64 U/L (ref 39–117)
BUN: 15 mg/dL (ref 6–23)
CO2: 26 mEq/L (ref 19–32)
Calcium: 9.7 mg/dL (ref 8.4–10.5)
Chloride: 104 mEq/L (ref 96–112)
Creatinine, Ser: 1.19 mg/dL (ref 0.40–1.50)
GFR: 62.16 mL/min (ref >60.00)
Glucose, Bld: 117 mg/dL — ABNORMAL HIGH (ref 70–99)
Potassium: 4.2 mEq/L (ref 3.5–5.1)
Sodium: 137 mEq/L (ref 135–145)
Total Bilirubin: 0.4 mg/dL (ref 0.2–1.2)
Total Protein: 7 g/dL (ref 6.0–8.3)

## 2021-06-28 LAB — CBC WITH DIFFERENTIAL/PLATELET
Basophils Absolute: 0 10*3/uL (ref 0.0–0.1)
Basophils Relative: 0.7 % (ref 0.0–3.0)
Eosinophils Absolute: 0 10*3/uL (ref 0.0–0.7)
Eosinophils Relative: 0.8 % (ref 0.0–5.0)
HCT: 40.7 % (ref 39.0–52.0)
Hemoglobin: 13.6 g/dL (ref 13.0–17.0)
Lymphocytes Relative: 27.6 % (ref 12.0–46.0)
Lymphs Abs: 1.6 10*3/uL (ref 0.7–4.0)
MCHC: 33.4 g/dL (ref 30.0–36.0)
MCV: 100.3 fl — ABNORMAL HIGH (ref 78.0–100.0)
Monocytes Absolute: 0.7 10*3/uL (ref 0.1–1.0)
Monocytes Relative: 11.3 % (ref 3.0–12.0)
Neutro Abs: 3.5 10*3/uL (ref 1.4–7.7)
Neutrophils Relative %: 59.6 % (ref 43.0–77.0)
Platelets: 233 10*3/uL (ref 150.0–400.0)
RBC: 4.06 Mil/uL — ABNORMAL LOW (ref 4.22–5.81)
RDW: 13.3 % (ref 11.5–15.5)
WBC: 5.8 10*3/uL (ref 4.0–10.5)

## 2021-07-01 DIAGNOSIS — K219 Gastro-esophageal reflux disease without esophagitis: Secondary | ICD-10-CM | POA: Diagnosis not present

## 2021-07-01 DIAGNOSIS — R0981 Nasal congestion: Secondary | ICD-10-CM | POA: Diagnosis not present

## 2021-07-01 DIAGNOSIS — R059 Cough, unspecified: Secondary | ICD-10-CM | POA: Diagnosis not present

## 2021-07-01 DIAGNOSIS — J343 Hypertrophy of nasal turbinates: Secondary | ICD-10-CM | POA: Diagnosis not present

## 2021-07-01 DIAGNOSIS — Z87898 Personal history of other specified conditions: Secondary | ICD-10-CM | POA: Diagnosis not present

## 2021-07-01 DIAGNOSIS — T485X5A Adverse effect of other anti-common-cold drugs, initial encounter: Secondary | ICD-10-CM | POA: Diagnosis not present

## 2021-07-01 DIAGNOSIS — J310 Chronic rhinitis: Secondary | ICD-10-CM | POA: Diagnosis not present

## 2021-07-01 DIAGNOSIS — J342 Deviated nasal septum: Secondary | ICD-10-CM | POA: Diagnosis not present

## 2021-07-08 ENCOUNTER — Ambulatory Visit (HOSPITAL_COMMUNITY)
Admission: RE | Admit: 2021-07-08 | Discharge: 2021-07-08 | Disposition: A | Discharge status: Home / Self Care | Payer: Medicare Other | Source: Ambulatory Visit | Attending: Gastroenterology | Admitting: Gastroenterology

## 2021-07-08 ENCOUNTER — Other Ambulatory Visit: Payer: Self-pay

## 2021-07-08 DIAGNOSIS — R109 Unspecified abdominal pain: Secondary | ICD-10-CM | POA: Diagnosis not present

## 2021-07-08 DIAGNOSIS — K219 Gastro-esophageal reflux disease without esophagitis: Secondary | ICD-10-CM | POA: Insufficient documentation

## 2021-07-08 DIAGNOSIS — R110 Nausea: Secondary | ICD-10-CM | POA: Diagnosis not present

## 2021-07-08 MED ORDER — SODIUM CHLORIDE (PF) 0.9 % IJ SOLN
INTRAMUSCULAR | Status: AC
  Filled 2021-07-08: qty 50

## 2021-07-08 MED ORDER — IOHEXOL 350 MG/ML SOLN
80.0000 mL | Freq: Once | INTRAVENOUS | Status: AC | PRN
  Administered 2021-07-08: 80 mL via INTRAVENOUS

## 2021-07-18 DIAGNOSIS — Z79899 Other long term (current) drug therapy: Secondary | ICD-10-CM | POA: Diagnosis not present

## 2021-07-18 DIAGNOSIS — G2581 Restless legs syndrome: Secondary | ICD-10-CM | POA: Diagnosis not present

## 2021-07-18 DIAGNOSIS — R011 Cardiac murmur, unspecified: Secondary | ICD-10-CM | POA: Diagnosis not present

## 2021-07-18 DIAGNOSIS — M19012 Primary osteoarthritis, left shoulder: Secondary | ICD-10-CM | POA: Diagnosis not present

## 2021-07-20 DIAGNOSIS — Z79899 Other long term (current) drug therapy: Secondary | ICD-10-CM | POA: Diagnosis not present

## 2021-08-10 ENCOUNTER — Encounter: Payer: Self-pay | Admitting: Gastroenterology

## 2021-09-19 DIAGNOSIS — J302 Other seasonal allergic rhinitis: Secondary | ICD-10-CM | POA: Diagnosis not present

## 2021-09-19 DIAGNOSIS — I515 Myocardial degeneration: Secondary | ICD-10-CM | POA: Diagnosis not present

## 2021-09-19 DIAGNOSIS — I700 Atherosclerosis of aorta: Secondary | ICD-10-CM | POA: Diagnosis not present

## 2021-09-21 DIAGNOSIS — G2581 Restless legs syndrome: Secondary | ICD-10-CM | POA: Diagnosis not present

## 2021-09-21 DIAGNOSIS — M5134 Other intervertebral disc degeneration, thoracic region: Secondary | ICD-10-CM | POA: Diagnosis not present

## 2021-10-05 DIAGNOSIS — Z91018 Allergy to other foods: Secondary | ICD-10-CM | POA: Diagnosis not present

## 2021-10-05 DIAGNOSIS — E7800 Pure hypercholesterolemia, unspecified: Secondary | ICD-10-CM | POA: Insufficient documentation

## 2021-10-05 DIAGNOSIS — J302 Other seasonal allergic rhinitis: Secondary | ICD-10-CM | POA: Diagnosis not present

## 2021-10-05 DIAGNOSIS — K2970 Gastritis, unspecified, without bleeding: Secondary | ICD-10-CM | POA: Insufficient documentation

## 2021-10-05 DIAGNOSIS — K5229 Other allergic and dietetic gastroenteritis and colitis: Secondary | ICD-10-CM | POA: Diagnosis not present

## 2021-10-05 DIAGNOSIS — R109 Unspecified abdominal pain: Secondary | ICD-10-CM | POA: Diagnosis not present

## 2021-10-05 DIAGNOSIS — N200 Calculus of kidney: Secondary | ICD-10-CM | POA: Insufficient documentation

## 2021-10-05 DIAGNOSIS — M1990 Unspecified osteoarthritis, unspecified site: Secondary | ICD-10-CM | POA: Insufficient documentation

## 2021-10-06 NOTE — Progress Notes (Signed)
?Cardiology Office Note:   ? ?Date:  10/07/2021  ? ?ID:  Gary Larson, DOB [birth year], MRN [id number] ? ?PCP:  Gary Roger, MD  ?Cardiologist:  Shirlee More, MD  ? ?Referring MD: Gary Roger, MD ? ?ASSESSMENT:   ? ?1. Coronary artery calcification seen on CAT scan   ?2. Heart murmur   ?3. Mixed hyperlipidemia   ? ?PLAN:   ? ?In order of problems listed above: ? ?His visit today is prompted by the CT scan finding of coronary artery calcification.  By itself not that unusual in his age group likely greater than 50% frequency however it certainly is a marker of increased cardiovascular risk.  Generally this is a trigger to start lipid-lowering therapy fortunately has been on long-term statin with good results.  I do not think a coronary calcium score would help as he always has evidence of coronary atherosclerosis he has no anginal discomfort normal EKG at this time after discussion he prefers not to have an ischemia evaluation. ?Continue his statin ?On physical exam he appears to have developed mild to moderate aortic stenosis and we will recheck his echocardiogram ? ?Next appointment 1 year ? ? ?Medication Adjustments/Labs and Tests Ordered: ?Current medicines are reviewed at length with the patient today.  Concerns regarding medicines are outlined above.  ?Orders Placed This Encounter  ?Procedures  ? EKG 12-Lead  ? ECHOCARDIOGRAM COMPLETE  ? ?No orders of the defined types were placed in this encounter. ?  ?Chief complaint: I was advised to the cardiac CTA calcium score ? ? ?History of Present Illness:   ? ?Gary Larson is a 71 y.o. male with hyperlipidemia coronary and aortic atherosclerosis on CT scan who is being seen today for consideration of an ischemia evaluation at the request of Gary Roger, MD. ? ?Chart review shows he had a cardiac echo performed December 2020 at [facility] for heart murmur.  Aortic valve was described as mildly thickened mitral valve was normal.  Had normal left ventricular systolic  function normal left atrial pressure with normal diastolic function right ventricle normal size and function and there is trivial aortic mitral and tricuspid regurgitation and no valvular stenosis. ? ?Seen by me today because of coronary artery calcification on CT scan. ?He has hyperlipidemia has been on long-term statin compliant and well-tolerated. ?He has a long-term history of heart murmur echocardiogram 3 years ago showed aortic valve sclerosis trivial regurgitation and no stenosis. ?He has had no chest pain shortness of breath exercise intolerance palpitation or syncope.  He is limited by chronic arthritis injury and pain in his shoulders. ?Recent labs 06/03/2021 cholesterol 156 LDL 82 triglycerides 145 HDL 49 A1c 5.8 hemoglobin 13.6 creatinine 1.90 ? ?Past Medical History:  ?Diagnosis Date  ? Arthritis   ? Elevated cholesterol   ? Gastritis   ? Kidney stones   ? ? ?Past Surgical History:  ?Procedure Laterality Date  ? CARPAL TUNNEL RELEASE Right   ? CHOLECYSTECTOMY  03/14/2018  ? COLONOSCOPY WITH ESOPHAGOGASTRODUODENOSCOPY (EGD)  2019  ? SHOULDER SURGERY Right   ? 4 times resected   ? ? ?Current Medications: ?Current Meds  ?Medication Sig  ? aspirin EC 81 MG tablet Take 81 mg by mouth daily. Swallow whole.  ? ondansetron (ZOFRAN ODT) 4 MG disintegrating tablet Dissolve 1 tablet on the tongue every 6 hours as needed for nausea.  ? pantoprazole (PROTONIX) 40 MG tablet Take 1 tablet (40 mg total) by mouth 2 (two) times daily for 14 days.  ?  simvastatin (ZOCOR) 40 MG tablet Take by mouth daily.  ? ?Current Facility-Administered Medications for the 10/07/21 encounter (Office Visit) with Richardo Priest, MD  ?Medication  ? 0.9 %  sodium chloride infusion  ?  ? ?Allergies:   Morphine  ? ?Social History  ? ?Socioeconomic History  ? Marital status: Married  ?  Spouse name: Gary Larson  ? Number of children: 0  ? Years of education: Not on file  ? Highest education level: Not on file  ?Occupational History  ? Occupation:  Retired   ?Tobacco Use  ? Smoking status: Never  ?  Passive exposure: Never  ? Smokeless tobacco: Never  ?Vaping Use  ? Vaping Use: Never used  ?Substance and Sexual Activity  ? Alcohol use: Not Currently  ? Drug use: Never  ? Sexual activity: Not on file  ?Other Topics Concern  ? Not on file  ?Social History Narrative  ? Not on file  ? ?Social Determinants of Health  ? ?Financial Resource Strain: Not on file  ?Food Insecurity: Not on file  ?Transportation Needs: Not on file  ?Physical Activity: Not on file  ?Stress: Not on file  ?Social Connections: Not on file  ?  ? ?Family History: ?The patient's family history includes Breast cancer in his sister; Colon cancer in his sister; Diabetes in his brother and brother; Esophageal cancer in his brother; Stomach cancer in his brother. There is no history of Colon polyps or Rectal cancer. ? ?ROS:   ?ROS Please see the history of present illness.    ? All other systems reviewed and are negative. ? ?EKGs/Labs/Other Studies Reviewed:   ? ?The following studies were reviewed today: ? ? ?EKG:  EKG is  ordered today.  The ekg ordered today is personally reviewed and demonstrates sinus rhythm and normal ? ? ? ?Physical Exam:   ? ?VS:  BP 124/80 (BP Location: Left Arm)   Pulse 72   Ht 6' (1.829 m)   Wt 192 lb (87.1 kg)   SpO2 99%   BMI 26.04 kg/m?    ? ?Wt Readings from Last 3 Encounters:  ?10/07/21 192 lb (87.1 kg)  ?05/09/21 195 lb 6 oz (88.6 kg)  ?08/14/18 185 lb (83.9 kg)  ?  ? ?GEN: Appears his age healthy well nourished, well developed in no acute distress ?HEENT: Normal ?NECK: No JVD; No carotid bruits ?LYMPHATICS: No lymphadenopathy ?CARDIAC: Is a very prominent heart murmur grade 2/6 systolic ejection murmur radiates up into the right clavicle and to the base of the right carotid has a grade 1/6 murmur of aortic regurgitation RRR, no rubs, gallops ?RESPIRATORY:  Clear to auscultation without rales, wheezing or rhonchi  ?ABDOMEN: Soft, non-tender,  non-distended ?MUSCULOSKELETAL:  No edema; No deformity  ?SKIN: Warm and dry ?NEUROLOGIC:  Alert and oriented x 3 ?PSYCHIATRIC:  Normal affect  ? ? ? ?Signed, ?Shirlee More, MD  ?10/07/2021 5:05 PM    ?[place] ?

## 2021-10-07 ENCOUNTER — Other Ambulatory Visit: Payer: Self-pay

## 2021-10-07 ENCOUNTER — Ambulatory Visit (INDEPENDENT_AMBULATORY_CARE_PROVIDER_SITE_OTHER): Payer: Medicare Other | Admitting: Cardiology

## 2021-10-07 ENCOUNTER — Encounter: Payer: Self-pay | Admitting: Cardiology

## 2021-10-07 VITALS — BP 124/80 | HR 72 | Ht 72.0 in | Wt 192.0 lb

## 2021-10-07 DIAGNOSIS — E782 Mixed hyperlipidemia: Secondary | ICD-10-CM | POA: Diagnosis not present

## 2021-10-07 DIAGNOSIS — I2510 Atherosclerotic heart disease of native coronary artery without angina pectoris: Secondary | ICD-10-CM

## 2021-10-07 DIAGNOSIS — R011 Cardiac murmur, unspecified: Secondary | ICD-10-CM | POA: Diagnosis not present

## 2021-10-07 NOTE — Patient Instructions (Signed)
Medication Instructions:  ?Your physician recommends that you continue on your current medications as directed. Please refer to the Current Medication list given to you today. ? ?*If you need a refill on your cardiac medications before your next appointment, please call your pharmacy* ? ? ?Lab Work: ?None ordered  ? ?If you have labs (blood work) drawn today and your tests are completely normal, you will receive your results only by: ?MyChart Message (if you have MyChart) OR ?A paper copy in the mail ?If you have any lab test that is abnormal or we need to change your treatment, we will call you to review the results. ? ? ?Testing/Procedures: ?Your physician has requested that you have an echocardiogram. Echocardiography is a painless test that uses sound waves to create images of your heart. It provides your doctor with information about the size and shape of your heart and how well your heart?s chambers and valves are working. This procedure takes approximately one hour. There are no restrictions for this procedure. ? ? ? ?Follow-Up: ?At [facility], you and your health needs are our priority.  As part of our continuing mission to provide you with exceptional heart care, we have created designated Provider Care Teams.  These Care Teams include your primary Cardiologist (physician) and Advanced Practice Providers (APPs -  Physician Assistants and Nurse Practitioners) who all work together to provide you with the care you need, when you need it. ? ?We recommend signing up for the patient portal called "MyChart".  Sign up information is provided on this After Visit Summary.  MyChart is used to connect with patients for Virtual Visits (Telemedicine).  Patients are able to view lab/test results, encounter notes, upcoming appointments, etc.  Non-urgent messages can be sent to your provider as well.   ?To learn more about what you can do with MyChart, go to [email or web address].   ? ?Your next appointment:   ?12  month(s) ? ?The format for your next appointment:   ?In Person ? ?Provider:   ?Norman Herrlich, MD  ? ? ?Other Instructions ?None   ?

## 2021-10-13 ENCOUNTER — Other Ambulatory Visit: Payer: Self-pay

## 2021-10-13 ENCOUNTER — Ambulatory Visit (INDEPENDENT_AMBULATORY_CARE_PROVIDER_SITE_OTHER): Payer: Medicare Other

## 2021-10-13 DIAGNOSIS — R011 Cardiac murmur, unspecified: Secondary | ICD-10-CM

## 2021-10-13 LAB — ECHOCARDIOGRAM COMPLETE
AR max vel: 0.99 cm2
AV Area VTI: 1.06 cm2
AV Area mean vel: 1.05 cm2
AV Mean grad: 20 mmHg
AV Peak grad: 36.8 mmHg
Ao pk vel: 3.04 m/s
Area-P 1/2: 2.87 cm2
P 1/2 time: 577 msec
S' Lateral: 2.9 cm

## 2021-10-25 DIAGNOSIS — R109 Unspecified abdominal pain: Secondary | ICD-10-CM | POA: Diagnosis not present

## 2021-10-25 DIAGNOSIS — J302 Other seasonal allergic rhinitis: Secondary | ICD-10-CM | POA: Diagnosis not present

## 2021-10-25 DIAGNOSIS — K5229 Other allergic and dietetic gastroenteritis and colitis: Secondary | ICD-10-CM | POA: Diagnosis not present

## 2021-10-25 DIAGNOSIS — Z91018 Allergy to other foods: Secondary | ICD-10-CM | POA: Diagnosis not present

## 2021-10-25 DIAGNOSIS — H6983 Other specified disorders of Eustachian tube, bilateral: Secondary | ICD-10-CM | POA: Diagnosis not present

## 2021-11-09 DIAGNOSIS — H43393 Other vitreous opacities, bilateral: Secondary | ICD-10-CM | POA: Diagnosis not present

## 2021-11-09 DIAGNOSIS — Z961 Presence of intraocular lens: Secondary | ICD-10-CM | POA: Diagnosis not present

## 2021-11-09 DIAGNOSIS — H53453 Other localized visual field defect, bilateral: Secondary | ICD-10-CM | POA: Diagnosis not present

## 2021-11-09 DIAGNOSIS — H52223 Regular astigmatism, bilateral: Secondary | ICD-10-CM | POA: Diagnosis not present

## 2021-11-21 DIAGNOSIS — H53453 Other localized visual field defect, bilateral: Secondary | ICD-10-CM | POA: Diagnosis not present

## 2021-11-21 DIAGNOSIS — Z961 Presence of intraocular lens: Secondary | ICD-10-CM | POA: Diagnosis not present

## 2021-12-19 DIAGNOSIS — M545 Low back pain, unspecified: Secondary | ICD-10-CM | POA: Diagnosis not present

## 2021-12-19 DIAGNOSIS — G2581 Restless legs syndrome: Secondary | ICD-10-CM | POA: Diagnosis not present

## 2021-12-19 DIAGNOSIS — G8929 Other chronic pain: Secondary | ICD-10-CM | POA: Diagnosis not present

## 2021-12-19 DIAGNOSIS — Z0000 Encounter for general adult medical examination without abnormal findings: Secondary | ICD-10-CM | POA: Diagnosis not present

## 2021-12-19 DIAGNOSIS — M5134 Other intervertebral disc degeneration, thoracic region: Secondary | ICD-10-CM | POA: Diagnosis not present

## 2022-03-13 DIAGNOSIS — J101 Influenza due to other identified influenza virus with other respiratory manifestations: Secondary | ICD-10-CM | POA: Diagnosis not present

## 2022-04-27 IMAGING — CT CT ABD-PELV W/O CM
2 of 4 series · 16 of 46 positions shown, 18 images · non-contrast
Comparison: CT abdomen pelvis 06/28/2016

CLINICAL DATA: Left flank pain.  History of kidney stones.

EXAM:
CT ABDOMEN AND PELVIS WITHOUT CONTRAST
TECHNIQUE: Multidetector CT imaging of the abdomen and pelvis was performed
following the standard protocol without IV contrast.

[Series 2: renal stone 5.00 br40 s3 axial · axial · 0.77mm/px · z∈[+1277,+1707]mm · 13 of 94 slices shown, 15 images]
[im 4/94  soft-tissue]
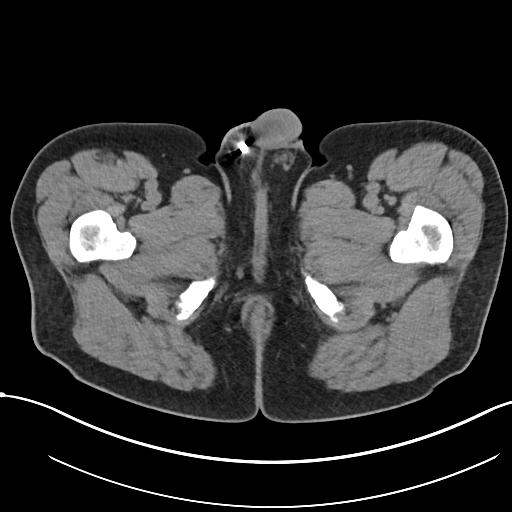
[im 4/94  bone]
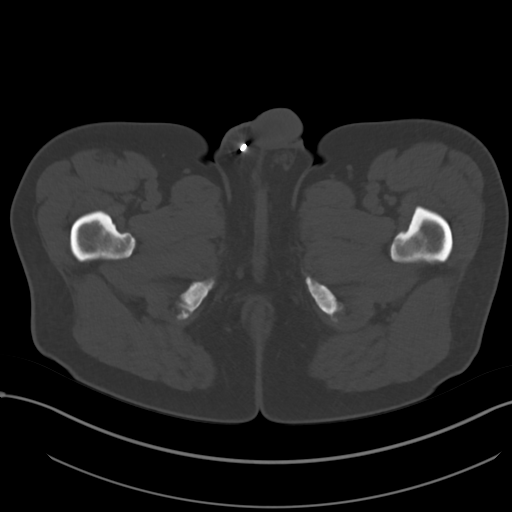
[im 12/94  soft-tissue]
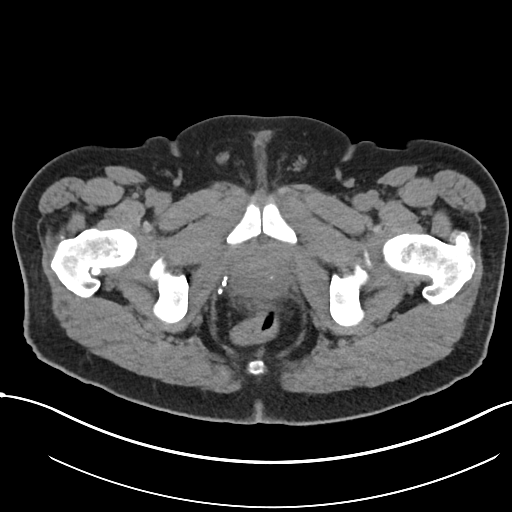
[im 19/94  soft-tissue]
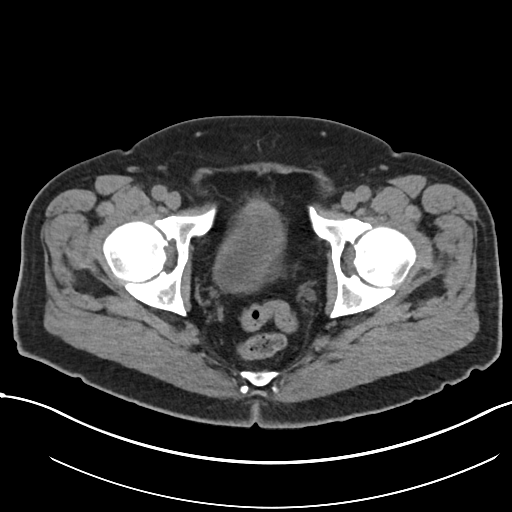
[im 27/94  soft-tissue]
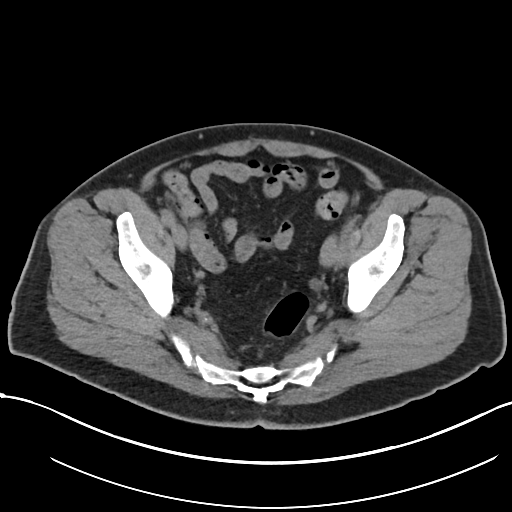
[im 34/94  soft-tissue]
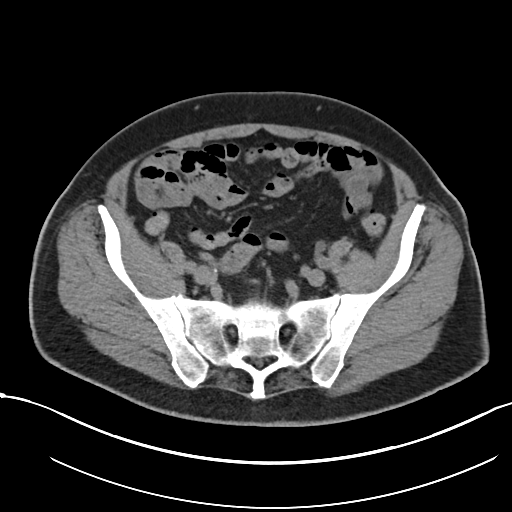
[im 41/94  soft-tissue]
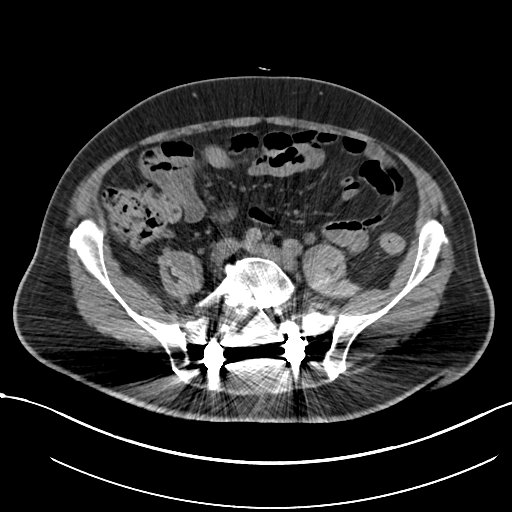
[im 49/94  soft-tissue]
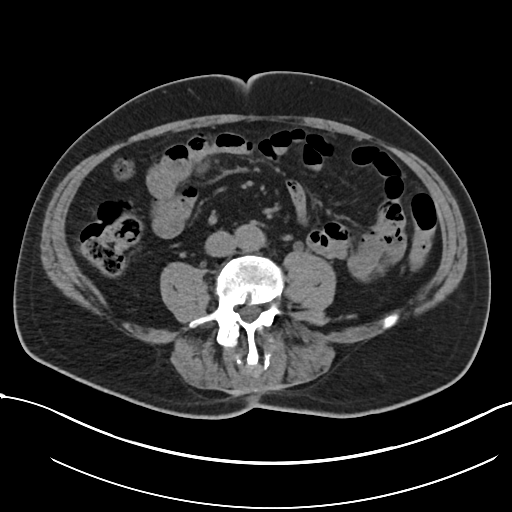
[im 53/94  soft-tissue]
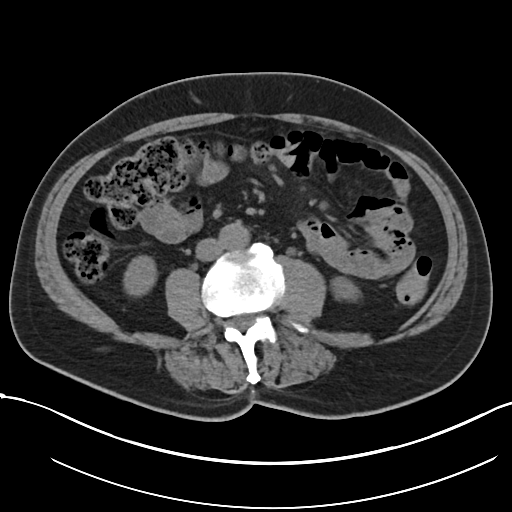
[im 60/94  soft-tissue]
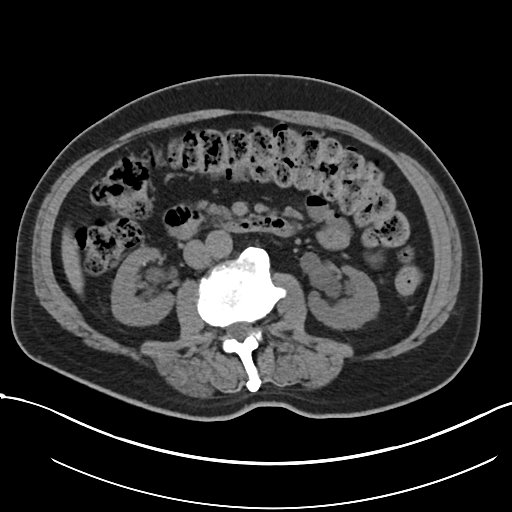
[im 60/94  bone]
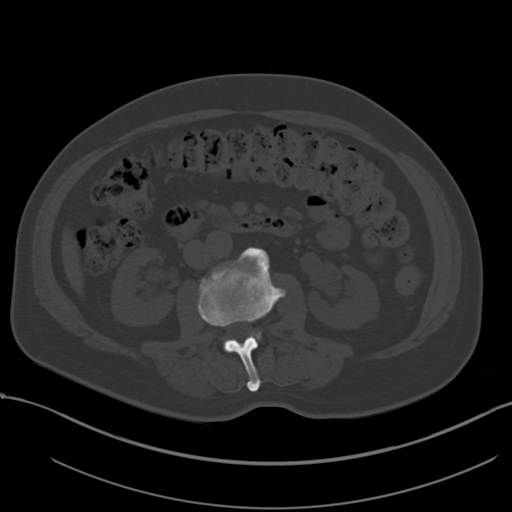
[im 67/94  soft-tissue]
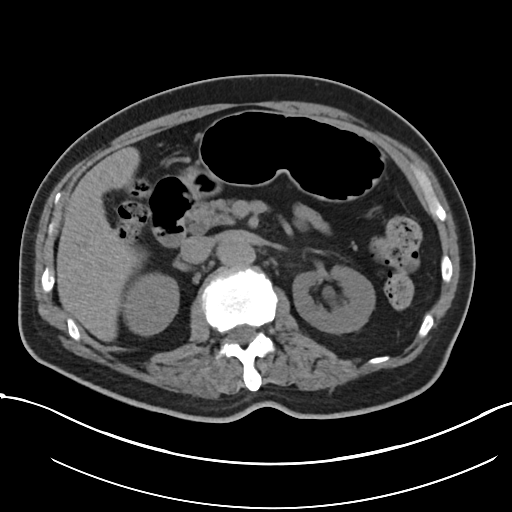
[im 75/94  soft-tissue]
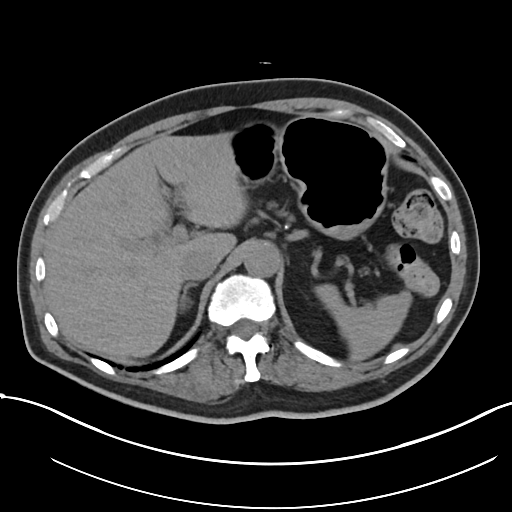
[im 82/94  soft-tissue]
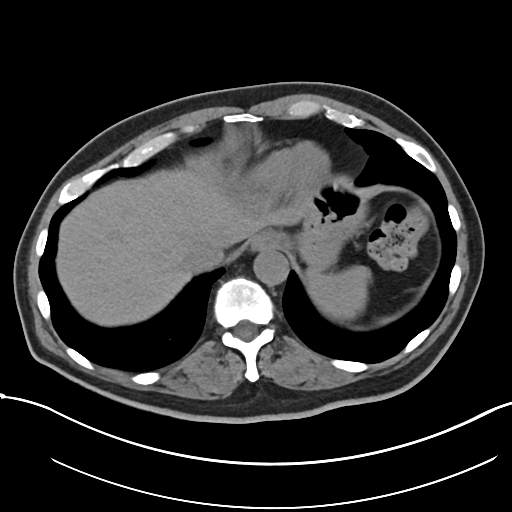
[im 90/94  soft-tissue]
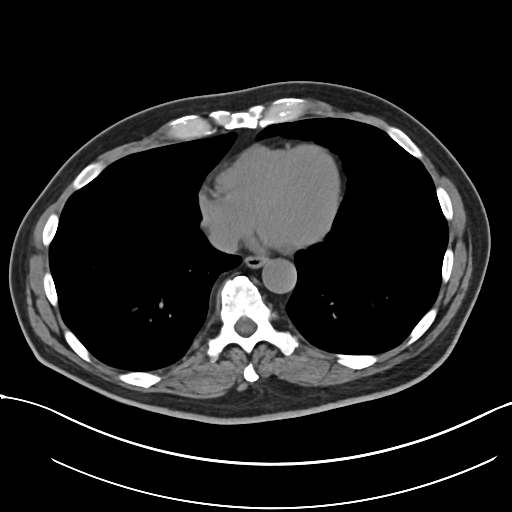

[Series 6: renal stone 2.00 br40 s3 cor · coronal · 0.77mm/px · 3 of 197 slices shown]
[im 66/197  soft-tissue]
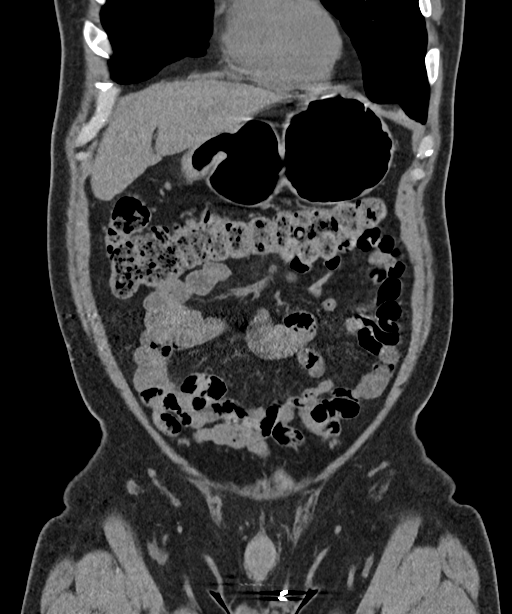
[im 88/197  soft-tissue]
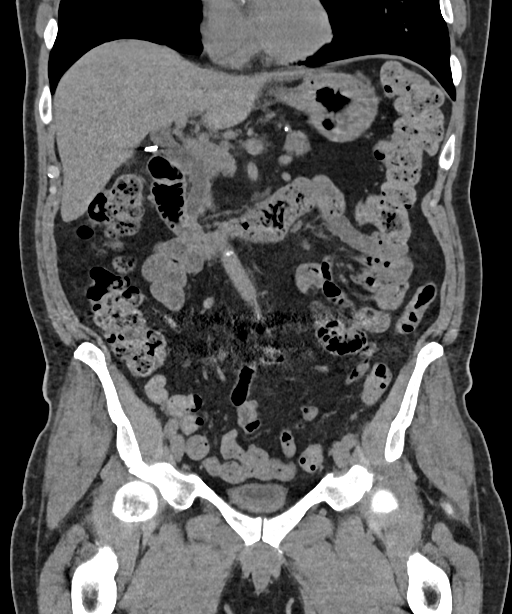
[im 109/197  soft-tissue]
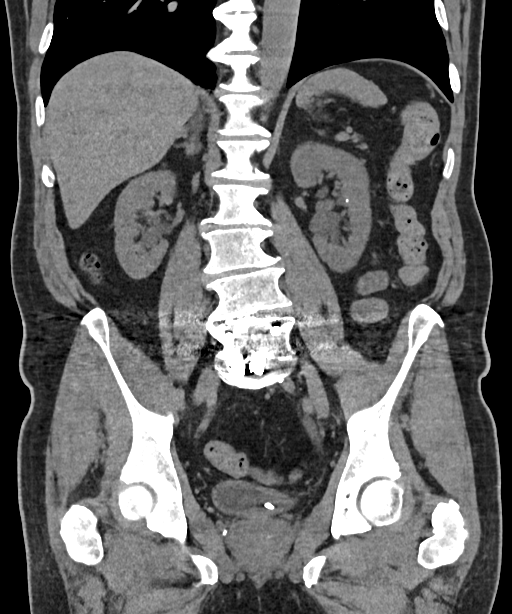

[16 of 46 positions shown; findings below may reference images not displayed]

FINDINGS: Lower chest: Lung bases clear bilaterally.

Hepatobiliary: No focal liver abnormality is seen. Status post
cholecystectomy. No biliary dilatation.

Pancreas: Negative

Spleen: Negative

Adrenals/Urinary Tract: Nonobstructing right renal calculi. 5 mm
right upper pole stone. 3 mm right midpole stone. 2 mm right lower
pole stone.

Multiple small left renal calculi. Moderate hydronephrosis and
hydroureter on the left. There is a 6 mm calculus in the left
bladder which appears to have passed in the bladder.

Bladder wall thickening.

Stomach/Bowel: Stomach is within normal limits. Appendix appears
normal. No evidence of bowel wall thickening, distention, or
inflammatory changes. Sigmoid diverticulosis. Moderate amount of
stool in the colon.

Vascular/Lymphatic: Negative

Reproductive: Mild prostate enlargement.

Other: No free fluid.

Musculoskeletal: No acute abnormality. Pedicle screw and interbody
fusion L5-S1
IMPRESSION: 1. Multiple bilateral renal calculi.
2. Moderate left hydronephrosis and hydroureter. 6 mm calculus in
the left ladder appears to have passed in the bladder, versus in the
distal left UVJ. Bladder wall thickening.

## 2022-06-07 ENCOUNTER — Ambulatory Visit (INDEPENDENT_AMBULATORY_CARE_PROVIDER_SITE_OTHER): Payer: Medicare Other | Admitting: Internal Medicine

## 2022-06-07 ENCOUNTER — Encounter: Payer: Self-pay | Admitting: Internal Medicine

## 2022-06-07 VITALS — BP 126/74 | HR 77 | Temp 97.6°F | Resp 18 | Ht 72.0 in | Wt 189.2 lb

## 2022-06-07 DIAGNOSIS — I2510 Atherosclerotic heart disease of native coronary artery without angina pectoris: Secondary | ICD-10-CM

## 2022-06-07 DIAGNOSIS — R011 Cardiac murmur, unspecified: Secondary | ICD-10-CM

## 2022-06-07 DIAGNOSIS — E7800 Pure hypercholesterolemia, unspecified: Secondary | ICD-10-CM

## 2022-06-07 DIAGNOSIS — M1990 Unspecified osteoarthritis, unspecified site: Secondary | ICD-10-CM | POA: Diagnosis not present

## 2022-06-07 DIAGNOSIS — K2100 Gastro-esophageal reflux disease with esophagitis, without bleeding: Secondary | ICD-10-CM

## 2022-06-07 DIAGNOSIS — R739 Hyperglycemia, unspecified: Secondary | ICD-10-CM

## 2022-06-07 DIAGNOSIS — M5136 Other intervertebral disc degeneration, lumbar region: Secondary | ICD-10-CM

## 2022-06-07 DIAGNOSIS — G4700 Insomnia, unspecified: Secondary | ICD-10-CM

## 2022-06-07 DIAGNOSIS — Z0000 Encounter for general adult medical examination without abnormal findings: Secondary | ICD-10-CM

## 2022-06-07 DIAGNOSIS — K2950 Unspecified chronic gastritis without bleeding: Secondary | ICD-10-CM

## 2022-06-07 DIAGNOSIS — Z6825 Body mass index (BMI) 25.0-25.9, adult: Secondary | ICD-10-CM

## 2022-06-07 DIAGNOSIS — M51369 Other intervertebral disc degeneration, lumbar region without mention of lumbar back pain or lower extremity pain: Secondary | ICD-10-CM | POA: Insufficient documentation

## 2022-06-07 DIAGNOSIS — R350 Frequency of micturition: Secondary | ICD-10-CM

## 2022-06-07 DIAGNOSIS — R3911 Hesitancy of micturition: Secondary | ICD-10-CM

## 2022-06-07 HISTORY — DX: Cardiac murmur, unspecified: R01.1

## 2022-06-07 HISTORY — DX: Insomnia, unspecified: G47.00

## 2022-06-07 HISTORY — DX: Atherosclerotic heart disease of native coronary artery without angina pectoris: I25.10

## 2022-06-07 HISTORY — DX: Gastro-esophageal reflux disease with esophagitis, without bleeding: K21.00

## 2022-06-07 MED ORDER — ALPRAZOLAM ER 0.5 MG PO TB24: 0.5000 mg | ORAL_TABLET | Freq: Every evening | ORAL | 2 refills | Status: DC | PRN
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
## 2022-06-08 LAB — CMP14 + ANION GAP
ALT: 17 IU/L (ref 0–44)
AST: 24 IU/L (ref 0–40)
Albumin/Globulin Ratio: 1.7 (ref 1.2–2.2)
Albumin: 4.8 g/dL (ref 3.9–4.9)
Alkaline Phosphatase: 82 IU/L (ref 44–121)
Anion Gap: 17 mmol/L (ref 10.0–18.0)
BUN/Creatinine Ratio: 15 (ref 10–24)
BUN: 16 mg/dL (ref 8–27)
Bilirubin Total: 0.4 mg/dL (ref 0.0–1.2)
CO2: 22 mmol/L (ref 20–29)
Calcium: 10.3 mg/dL — ABNORMAL HIGH (ref 8.6–10.2)
Chloride: 100 mmol/L (ref 96–106)
Creatinine, Ser: 1.05 mg/dL (ref 0.76–1.27)
Globulin, Total: 2.9 g/dL (ref 1.5–4.5)
Glucose: 97 mg/dL (ref 70–99)
Potassium: 5.6 mmol/L — ABNORMAL HIGH (ref 3.5–5.2)
Sodium: 139 mmol/L (ref 134–144)
Total Protein: 7.7 g/dL (ref 6.0–8.5)
eGFR: 76 mL/min/{1.73_m2} (ref >59)

## 2022-06-08 LAB — CBC WITH DIFFERENTIAL/PLATELET
Basophils Absolute: 0.1 10*3/uL (ref 0.0–0.2)
Basos: 1 %
EOS (ABSOLUTE): 0.1 10*3/uL (ref 0.0–0.4)
Eos: 1 %
Hematocrit: 46 % (ref 37.5–51.0)
Hemoglobin: 15.6 g/dL (ref 13.0–17.7)
Immature Grans (Abs): 0 10*3/uL (ref 0.0–0.1)
Immature Granulocytes: 1 %
Lymphocytes Absolute: 1.6 10*3/uL (ref 0.7–3.1)
Lymphs: 31 %
MCH: 33.3 pg — ABNORMAL HIGH (ref 26.6–33.0)
MCHC: 33.9 g/dL (ref 31.5–35.7)
MCV: 98 fL — ABNORMAL HIGH (ref 79–97)
Monocytes Absolute: 0.7 10*3/uL (ref 0.1–0.9)
Monocytes: 13 %
Neutrophils Absolute: 2.8 10*3/uL (ref 1.4–7.0)
Neutrophils: 53 %
Platelets: 287 10*3/uL (ref 150–450)
RBC: 4.68 x10E6/uL (ref 4.14–5.80)
RDW: 12.8 % (ref 11.6–15.4)
WBC: 5.2 10*3/uL (ref 3.4–10.8)

## 2022-06-08 LAB — PSA: Prostate Specific Ag, Serum: 1.3 ng/mL (ref 0.0–4.0)

## 2022-06-08 LAB — LIPID PANEL
Chol/HDL Ratio: 3.2 ratio (ref 0.0–5.0)
Cholesterol, Total: 175 mg/dL (ref 100–199)
HDL: 54 mg/dL (ref >39)
LDL Chol Calc (NIH): 99 mg/dL (ref 0–99)
Triglycerides: 121 mg/dL (ref 0–149)
VLDL Cholesterol Cal: 22 mg/dL (ref 5–40)

## 2022-06-08 LAB — TSH: TSH: 1.47 u[IU]/mL (ref 0.450–4.500)

## 2022-06-08 LAB — HEMOGLOBIN A1C
Est. average glucose Bld gHb Est-mCnc: 117 mg/dL
Hgb A1c MFr Bld: 5.7 % — ABNORMAL HIGH (ref 4.8–5.6)

## 2022-06-13 ENCOUNTER — Telehealth: Payer: Self-pay | Admitting: Internal Medicine

## 2022-06-13 NOTE — Telephone Encounter (Signed)
Calling for lab results. °

## 2022-06-23 DIAGNOSIS — Z23 Encounter for immunization: Secondary | ICD-10-CM | POA: Diagnosis not present

## 2022-06-28 ENCOUNTER — Ambulatory Visit (INDEPENDENT_AMBULATORY_CARE_PROVIDER_SITE_OTHER): Payer: Medicare Other

## 2022-06-28 HISTORY — DX: Hypercalcemia: E83.52

## 2022-06-29 LAB — BASIC METABOLIC PANEL
BUN/Creatinine Ratio: 13 (ref 10–24)
BUN: 14 mg/dL (ref 8–27)
CO2: 23 mmol/L (ref 20–29)
Calcium: 10.3 mg/dL — ABNORMAL HIGH (ref 8.6–10.2)
Chloride: 101 mmol/L (ref 96–106)
Creatinine, Ser: 1.08 mg/dL (ref 0.76–1.27)
Glucose: 94 mg/dL (ref 70–99)
Potassium: 5 mmol/L (ref 3.5–5.2)
Sodium: 142 mmol/L (ref 134–144)
eGFR: 74 mL/min/{1.73_m2} (ref >59)

## 2022-07-18 NOTE — Progress Notes (Signed)
Pt is aware.  

## 2022-08-02 ENCOUNTER — Encounter: Payer: Self-pay | Admitting: Internal Medicine

## 2022-08-02 ENCOUNTER — Ambulatory Visit: Payer: Medicare Other | Admitting: Internal Medicine

## 2022-08-02 VITALS — BP 110/68 | HR 88 | Temp 98.0°F | Resp 16 | Ht 72.0 in | Wt 189.6 lb

## 2022-08-02 DIAGNOSIS — M545 Low back pain, unspecified: Secondary | ICD-10-CM

## 2022-08-02 DIAGNOSIS — R051 Acute cough: Secondary | ICD-10-CM

## 2022-08-02 HISTORY — DX: Low back pain, unspecified: M54.50

## 2022-08-02 NOTE — Assessment & Plan Note (Signed)
We will obtain a xray of his lumbar spine to start out with.  He is reqeusting a MRI and we may need to go to this.

## 2022-08-02 NOTE — Assessment & Plan Note (Signed)
He wishes to give a sputum culture and obtain a xray.  I asked if he wants to start empiric antibiotics and he states he wants to wait on his respiratory culture.
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
## 2022-08-03 DIAGNOSIS — M545 Low back pain, unspecified: Secondary | ICD-10-CM | POA: Diagnosis not present

## 2022-08-03 DIAGNOSIS — R059 Cough, unspecified: Secondary | ICD-10-CM | POA: Diagnosis not present

## 2022-08-03 DIAGNOSIS — R051 Acute cough: Secondary | ICD-10-CM | POA: Diagnosis not present

## 2022-08-04 DIAGNOSIS — R051 Acute cough: Secondary | ICD-10-CM | POA: Diagnosis not present

## 2022-08-15 ENCOUNTER — Other Ambulatory Visit: Payer: Self-pay

## 2022-08-15 DIAGNOSIS — I350 Nonrheumatic aortic (valve) stenosis: Secondary | ICD-10-CM

## 2022-09-13 ENCOUNTER — Other Ambulatory Visit: Payer: Self-pay | Admitting: Internal Medicine

## 2022-09-13 DIAGNOSIS — G4700 Insomnia, unspecified: Secondary | ICD-10-CM

## 2022-09-15 ENCOUNTER — Other Ambulatory Visit: Payer: Self-pay | Admitting: Internal Medicine

## 2022-09-15 DIAGNOSIS — G4700 Insomnia, unspecified: Secondary | ICD-10-CM

## 2022-09-15 MED ORDER — ALPRAZOLAM ER 0.5 MG PO TB24: 0.5000 mg | ORAL_TABLET | Freq: Every evening | ORAL | 2 refills | Status: DC | PRN

## 2022-09-18 NOTE — Telephone Encounter (Signed)
Already filled

## 2022-09-22 ENCOUNTER — Ambulatory Visit: Payer: Medicare Other | Attending: Cardiology

## 2022-09-22 DIAGNOSIS — I350 Nonrheumatic aortic (valve) stenosis: Secondary | ICD-10-CM | POA: Diagnosis not present

## 2022-09-22 LAB — ECHOCARDIOGRAM COMPLETE
AR max vel: 1.08 cm2
AV Area VTI: 1.25 cm2
AV Area mean vel: 1.12 cm2
AV Mean grad: 17.3 mmHg
AV Peak grad: 32 mmHg
Ao pk vel: 2.83 m/s
Area-P 1/2: 3.34 cm2
P 1/2 time: 523 msec
S' Lateral: 2 cm

## 2022-09-26 DIAGNOSIS — G894 Chronic pain syndrome: Secondary | ICD-10-CM | POA: Insufficient documentation

## 2022-09-26 DIAGNOSIS — Z9889 Other specified postprocedural states: Secondary | ICD-10-CM | POA: Insufficient documentation

## 2022-09-26 DIAGNOSIS — K219 Gastro-esophageal reflux disease without esophagitis: Secondary | ICD-10-CM | POA: Insufficient documentation

## 2022-09-27 NOTE — Progress Notes (Unsigned)
Cardiology Office Note:    Date:  09/28/2022   ID:  Gary Larson, DOB [birth year], MRN [id number]  PCP:  Gary Roger, MD  Cardiologist:  Gary More, MD    Referring MD: Gary Roger, MD    ASSESSMENT:    1. Aortic valve stenosis, etiology of cardiac valve disease unspecified   2. Coronary artery calcification seen on CAT scan   3. Mixed hyperlipidemia    PLAN:    In order of problems listed above:  I reviewed his echocardiogram I would cause aortic stenosis Larson mild to moderate he has had no progression plan to recheck an echocardiogram in 18 months see me in the office follow-up will initiate endocarditis prophylaxis with amoxicillin.  Currently is not in the guidelines however there is a significant relative risk with aortic stenosis and it is my practice Continue his lipid-lowering therapy finger   Next appointment: 18 months  Medication Adjustments/Labs and Tests Ordered: Current medicines are reviewed at length with the patient today.  Concerns regarding medicines are outlined above.  No orders of the defined types were placed in this encounter.  No orders of the defined types were placed in this encounter.      History of Present Illness:    Gary Larson is a 72 y.o. male with a hx of coronary calcification on CT scan hyperlipidemia and aortic stenosis moderate valve area 1.06 cm mean gradient 20 mmHg mild aortic regurgitation and trileaflet valve with mild calcification on echocardiogram performed 10/13/2021 last seen 10/07/2021.  [name] echocardiogram 09/22/2022 showed moderate aortic valve calcification mild regurgitation and moderate stenosis mean gradient of 17.2 mmHg valve area 1.25 cm  compliance with diet, lifestyle and medications: Yes  [name] is here with his wife who participates in evaluation decision making Symptomatically has had no chest pain edema or syncope Will initiate endocarditis prophylaxis with aortic stenosis. EKG today shows sinus  rhythm and is normal Most recent labs 06/07/2022 LDL 99 cholesterol 175 A1c 5.9% hemoglobin 15.6 creatinine 1.08 Past Medical History:  Diagnosis Date   Arthritis    Chronic pain syndrome    Degenerative disc disease, lumbar    Gastritis    GERD (gastroesophageal reflux disease)    History of carpal tunnel release    Hypercholesterolemia    Kidney stones     Past Surgical History:  Procedure Laterality Date   CARPAL TUNNEL RELEASE Right    CHOLECYSTECTOMY  03/14/2018   COLONOSCOPY WITH ESOPHAGOGASTRODUODENOSCOPY (EGD)  2019   SHOULDER SURGERY Right    4 times resected     Current Medications: No outpatient medications have been marked as taking for the 09/28/22 encounter (Appointment) with Richardo Priest, MD.   Current Facility-Administered Medications for the 09/28/22 encounter (Appointment) with Richardo Priest, MD  Medication   0.9 %  sodium chloride infusion     Allergies:   Morphine   Social History   Socioeconomic History   Marital status: Married    Spouse name: Gary Larson   Number of children: 0   Years of education: Not on file   Highest education level: Not on file  Occupational History   Occupation: Retired   Tobacco Use   Smoking status: Never    Passive exposure: Never   Smokeless tobacco: Never  Vaping Use   Vaping Use: Never used  Substance and Sexual Activity   Alcohol use: Not Currently   Drug use: Never   Sexual activity: Not on file  Other Topics Concern  Not on file  Social History Narrative   Not on file   Social Determinants of Health   Financial Resource Strain: Not on file  Food Insecurity: Not on file  Transportation Needs: Not on file  Physical Activity: Not on file  Stress: Not on file  Social Connections: Not on file     Family History: The patient's family history includes Breast cancer in his sister; Colon cancer in his sister; Diabetes in his brother and brother; Esophageal cancer in his brother; Stomach cancer in his  brother. There is no history of Colon polyps or Rectal cancer. ROS:   Please see the history of present illness.    All other systems reviewed and are negative.  EKGs/Labs/Other Studies Reviewed:    The following studies were reviewed today: See history   Physical Exam:    VS:  There were no vitals taken for this visit.    Wt Readings from Last 3 Encounters:  08/02/22 189 lb 9.6 oz (86 kg)  06/07/22 189 lb 3.2 oz (85.8 kg)  10/07/21 192 lb (87.1 kg)     GEN:  Well nourished, well developed in no acute distress HEENT: Normal NECK: No JVD; No carotid bruits LYMPHATICS: No lymphadenopathy CARDIAC: Grade 2 out of 6 midsystolic murmur aortic stenosis localized aortic area does not encompass S2 no AR RRR, no murmurs, rubs, gallops RESPIRATORY:  Clear to auscultation without rales, wheezing or rhonchi  ABDOMEN: Soft, non-tender, non-distended MUSCULOSKELETAL:  No edema; No deformity  SKIN: Warm and dry NEUROLOGIC:  Alert and oriented x 3 PSYCHIATRIC:  Normal affect    Signed, Gary More, MD  09/28/2022 1:15 PM    [facility]

## 2022-09-28 ENCOUNTER — Ambulatory Visit: Payer: Medicare Other | Attending: Cardiology | Admitting: Cardiology

## 2022-09-28 ENCOUNTER — Encounter: Payer: Self-pay | Admitting: Cardiology

## 2022-09-28 VITALS — BP 110/70 | HR 73 | Ht 72.0 in | Wt 197.0 lb

## 2022-09-28 DIAGNOSIS — I2510 Atherosclerotic heart disease of native coronary artery without angina pectoris: Secondary | ICD-10-CM | POA: Insufficient documentation

## 2022-09-28 DIAGNOSIS — I350 Nonrheumatic aortic (valve) stenosis: Secondary | ICD-10-CM | POA: Diagnosis not present

## 2022-09-28 DIAGNOSIS — E782 Mixed hyperlipidemia: Secondary | ICD-10-CM | POA: Diagnosis not present

## 2022-09-28 MED ORDER — AMOXICILLIN 500 MG PO TABS: ORAL_TABLET | ORAL | 0 refills | Status: DC

## 2022-09-28 NOTE — Patient Instructions (Signed)
Medication Instructions:  Your physician has recommended you make the following change in your medication:   START: Amoxicillin 500 mg (Take 4 tablets = 2000 mg one hour pre-dental procedure)  *If you need a refill on your cardiac medications before your next appointment, please call your pharmacy*   Lab Work: None If you have labs (blood work) drawn today and your tests are completely normal, you will receive your results only by: [place] (if you have MyChart) OR A paper copy in the mail If you have any lab test that is abnormal or we need to change your treatment, we will call you to review the results.   Testing/Procedures: Your physician has requested that you have an echocardiogram. Echocardiography is a painless test that uses sound waves to create images of your heart. It provides your doctor with information about the size and shape of your heart and how well your heart's chambers and valves are working. This procedure takes approximately one hour. There are no restrictions for this procedure. Please do NOT wear cologne, perfume, aftershave, or lotions (deodorant is allowed). Please arrive 15 minutes prior to your appointment time.    Follow-Up: At [facility], you and your health needs are our priority.  As part of our continuing mission to provide you with exceptional heart care, we have created designated Provider Care Teams.  These Care Teams include your primary Cardiologist (physician) and Advanced Practice Providers (APPs -  Physician Assistants and Nurse Practitioners) who all work together to provide you with the care you need, when you need it.  We recommend signing up for the patient portal called "MyChart".  Sign up information is provided on this After Visit Summary.  MyChart is used to connect with patients for Virtual Visits (Telemedicine).  Patients are able to view lab/test results, encounter notes, upcoming appointments, etc.  Non-urgent messages  can be sent to your provider as well.   To learn more about what you can do with MyChart, go to [email or web address].    Your next appointment:   18 month(s)  Provider:   Shirlee More, MD    Other Instructions None

## 2022-10-06 ENCOUNTER — Other Ambulatory Visit: Payer: Self-pay

## 2022-10-06 MED ORDER — PROMETHAZINE-DM 6.25-15 MG/5ML PO SYRP: 2.5000 mL | ORAL_SOLUTION | Freq: Four times a day (QID) | ORAL | 0 refills | Status: DC | PRN

## 2022-11-14 ENCOUNTER — Other Ambulatory Visit: Payer: Self-pay

## 2022-11-14 MED ORDER — PROMETHAZINE-DM 6.25-15 MG/5ML PO SYRP: 2.5000 mL | ORAL_SOLUTION | Freq: Four times a day (QID) | ORAL | 0 refills | Status: DC | PRN

## 2022-11-27 DIAGNOSIS — H53453 Other localized visual field defect, bilateral: Secondary | ICD-10-CM | POA: Diagnosis not present

## 2022-11-27 DIAGNOSIS — H524 Presbyopia: Secondary | ICD-10-CM | POA: Diagnosis not present

## 2022-11-27 DIAGNOSIS — Z961 Presence of intraocular lens: Secondary | ICD-10-CM | POA: Diagnosis not present

## 2022-12-01 ENCOUNTER — Ambulatory Visit: Payer: Medicare Other | Admitting: Internal Medicine

## 2022-12-01 VITALS — BP 118/70 | HR 65 | Temp 97.8°F | Resp 16 | Ht 72.0 in | Wt 191.4 lb

## 2022-12-01 DIAGNOSIS — I2510 Atherosclerotic heart disease of native coronary artery without angina pectoris: Secondary | ICD-10-CM | POA: Diagnosis not present

## 2022-12-01 DIAGNOSIS — E7800 Pure hypercholesterolemia, unspecified: Secondary | ICD-10-CM

## 2022-12-01 NOTE — Assessment & Plan Note (Signed)
We will check his FLP today.  His goal LDL <70 with his CT scan readings as above.

## 2022-12-01 NOTE — Assessment & Plan Note (Signed)
He has a probable high calcium scoring risk if they see calcifications on his CT scan.  Continue risk factor modificaiton.

## 2022-12-01 NOTE — Progress Notes (Signed)
Office Visit  Subjective   Patient ID: Gary Larson   DOB: 1950/09/06   Age: 72 y.o.   MRN: [id number]   Chief Complaint Chief Complaint  Patient presents with   Follow-up     History of Present Illness Brayven A. Gane returns today for routine followup on his cholesterol. Overall, he states he is doing well and is without any complaints or problems at this time. He specifically denies chest pain, abdominal pain, nausea, diarrhea, and myalgias. He remains on dietary management as well as the following cholesterol lowering medications simvastatin 40 mg oral tablet qhs. He is fasting in anticipation for labs today.     He had preoperative ECHO done in 07/2018 (prior to his spinal fusion) where myself and the [facility] surgeons heard a heart murmur.  This ECHO demonstrated a normal LV EF 55% with normal diastolic pattern and normal valves.  He was noted to have a systolic ejection murmur in 2022 where he had an ECHO.  I had sent him to see Dr. Dulce Sellar where his ECHO noted he had a mildly thickened mitral valve but this was normal.  He has normal LVEF and normal diastolic pattern.  They noted some coronary calcifications on a CT scan but had no symptoms of angina.  I had sent him to see Dr. Dulce Sellar whom he saw in 09/2021.  He did not think a coronary calcium score would be helpful but recommended to continue on his statin as this finding on his CT scan was a marker for increased cardiovascular risk.       Past Medical History Past Medical History:  Diagnosis Date   Arthritis    Chronic pain syndrome    Degenerative disc disease, lumbar    Gastritis    GERD (gastroesophageal reflux disease)    History of carpal tunnel release    Hypercholesterolemia    Kidney stones      Allergies Allergies  Allergen Reactions   Morphine Itching     Medications  Current Outpatient Medications:    ALPRAZolam (XANAX XR) 0.5 MG 24 hr tablet, Take 1 tablet (0.5 mg total) by mouth at bedtime as needed for  anxiety., Disp: 30 tablet, Rfl: 2   aspirin EC 81 MG tablet, Take 81 mg by mouth daily. Swallow whole., Disp: , Rfl:    promethazine-dextromethorphan (PROMETHAZINE-DM) 6.25-15 MG/5ML syrup, Take 2.5 mLs by mouth 4 (four) times daily as needed for cough., Disp: 118 mL, Rfl: 0   simvastatin (ZOCOR) 40 MG tablet, Take by mouth daily., Disp: , Rfl:   Current Facility-Administered Medications:    0.9 %  sodium chloride infusion, 500 mL, Intravenous, Once, Lynann Bologna, MD   Review of Systems Review of Systems  Constitutional:  Negative for chills and fever.  Eyes:  Negative for blurred vision and double vision.  Respiratory:  Negative for cough and shortness of breath.   Cardiovascular:  Negative for chest pain, palpitations and leg swelling.  Gastrointestinal:  Negative for abdominal pain, constipation, diarrhea, nausea and vomiting.  Musculoskeletal:  Negative for myalgias.  Neurological:  Negative for dizziness, weakness and headaches.       Objective:    Vitals BP 118/70   Pulse 65   Temp 97.8 F (36.6 C)   Resp 16   Ht 6' (1.829 m)   Wt 191 lb 6.4 oz (86.8 kg)   SpO2 91%   BMI 25.96 kg/m    Physical Examination Physical Exam Constitutional:      Appearance: Normal  appearance. He is not ill-appearing.  Cardiovascular:     Rate and Rhythm: Normal rate and regular rhythm.     Pulses: Normal pulses.     Heart sounds: No murmur heard.    No friction rub. No gallop.  Pulmonary:     Effort: Pulmonary effort is normal. No respiratory distress.     Breath sounds: No wheezing, rhonchi or rales.  Abdominal:     General: Abdomen is flat. Bowel sounds are normal. There is no distension.     Palpations: Abdomen is soft.     Tenderness: There is no abdominal tenderness.  Musculoskeletal:     Right lower leg: No edema.     Left lower leg: No edema.  Skin:    General: Skin is warm and dry.     Findings: No rash.  Neurological:     Mental Status: He is alert.         Assessment & Plan:   Coronary artery calcification seen on CAT scan He has a probable high calcium scoring risk if they see calcifications on his CT scan.  Continue risk factor modificaiton.  Hypercholesterolemia We will check his FLP today.  His goal LDL <70 with his CT scan readings as above.      Return in about 6 months (around 06/06/2023) for annual.   Crist Fat, MD

## 2022-12-02 LAB — CMP14 + ANION GAP
ALT: 19 IU/L (ref 0–44)
AST: 19 IU/L (ref 0–40)
Albumin/Globulin Ratio: 1.6 (ref 1.2–2.2)
Albumin: 4.6 g/dL (ref 3.8–4.8)
Alkaline Phosphatase: 85 IU/L (ref 44–121)
Anion Gap: 17 mmol/L (ref 10.0–18.0)
BUN/Creatinine Ratio: 20 (ref 10–24)
BUN: 22 mg/dL (ref 8–27)
Bilirubin Total: 0.4 mg/dL (ref 0.0–1.2)
CO2: 19 mmol/L — ABNORMAL LOW (ref 20–29)
Calcium: 9.7 mg/dL (ref 8.6–10.2)
Chloride: 103 mmol/L (ref 96–106)
Creatinine, Ser: 1.09 mg/dL (ref 0.76–1.27)
Globulin, Total: 2.9 g/dL (ref 1.5–4.5)
Glucose: 96 mg/dL (ref 70–99)
Potassium: 4.8 mmol/L (ref 3.5–5.2)
Sodium: 139 mmol/L (ref 134–144)
Total Protein: 7.5 g/dL (ref 6.0–8.5)
eGFR: 73 mL/min/{1.73_m2} (ref >59)

## 2022-12-02 LAB — LIPID PANEL
Chol/HDL Ratio: 3.1 ratio (ref 0.0–5.0)
Cholesterol, Total: 165 mg/dL (ref 100–199)
HDL: 54 mg/dL (ref >39)
LDL Chol Calc (NIH): 93 mg/dL (ref 0–99)
Triglycerides: 101 mg/dL (ref 0–149)
VLDL Cholesterol Cal: 18 mg/dL (ref 5–40)

## 2022-12-05 ENCOUNTER — Other Ambulatory Visit: Payer: Self-pay

## 2022-12-05 MED ORDER — ATORVASTATIN CALCIUM 80 MG PO TABS: 80.0000 mg | ORAL_TABLET | Freq: Every day | ORAL | 1 refills | Status: DC

## 2022-12-21 ENCOUNTER — Other Ambulatory Visit: Payer: Self-pay | Admitting: Internal Medicine

## 2022-12-21 DIAGNOSIS — G4700 Insomnia, unspecified: Secondary | ICD-10-CM

## 2022-12-22 ENCOUNTER — Other Ambulatory Visit: Payer: Self-pay

## 2022-12-22 MED ORDER — ATORVASTATIN CALCIUM 80 MG PO TABS: 80.0000 mg | ORAL_TABLET | Freq: Every day | ORAL | 1 refills | Status: DC

## 2022-12-23 ENCOUNTER — Other Ambulatory Visit: Payer: Self-pay | Admitting: Internal Medicine

## 2022-12-23 DIAGNOSIS — G4700 Insomnia, unspecified: Secondary | ICD-10-CM

## 2022-12-23 MED ORDER — ALPRAZOLAM ER 0.5 MG PO TB24
0.5000 mg | ORAL_TABLET | Freq: Every evening | ORAL | 2 refills | Status: DC | PRN
Start: 2022-12-23 — End: 2023-03-27

## 2022-12-26 NOTE — Telephone Encounter (Signed)
Already sent in by Leonia Reader

## 2023-01-11 ENCOUNTER — Encounter: Payer: Self-pay | Admitting: Internal Medicine

## 2023-03-25 ENCOUNTER — Other Ambulatory Visit: Payer: Self-pay | Admitting: Internal Medicine

## 2023-03-25 DIAGNOSIS — G4700 Insomnia, unspecified: Secondary | ICD-10-CM

## 2023-03-26 ENCOUNTER — Encounter: Payer: Self-pay | Admitting: Internal Medicine

## 2023-03-26 ENCOUNTER — Ambulatory Visit: Payer: Medicare Other | Admitting: Internal Medicine

## 2023-03-26 VITALS — BP 126/82 | HR 83 | Temp 98.4°F | Resp 17 | Ht 73.0 in | Wt 191.8 lb

## 2023-03-26 DIAGNOSIS — G4700 Insomnia, unspecified: Secondary | ICD-10-CM | POA: Diagnosis not present

## 2023-03-26 DIAGNOSIS — R55 Syncope and collapse: Secondary | ICD-10-CM | POA: Diagnosis not present

## 2023-03-26 HISTORY — DX: Syncope and collapse: R55

## 2023-03-26 NOTE — Assessment & Plan Note (Signed)
His neuro exam is completely normal.  I did an EKG on him and this shows NSR without any ST/T wave changes.  I also did orthostatic on him and his BP and HR remained normal.  I am going to obtain a zio patch on him at this time.  If this is normal, we will refer him to Dr. Dulce Sellar for evaluation and to see if we can get him setup for a tilt table test.

## 2023-03-26 NOTE — Progress Notes (Signed)
Office Visit  Subjective   Patient ID: Gary Larson   DOB: 07-06-1951   Age: 72 y.o.   MRN: [id number]   Chief Complaint Chief Complaint  Patient presents with   Acute Visit     History of Present Illness Gary Larson is a 72 yo male who comes in today with complaints of dizziness and passing out.  This started a few years ago but this was rare but this started occurring a few times each week about 3-4 months ago.  This usually occurs when he goes from a sitting to standing position.  He has had presyncopal episodes of this in the past but had a full syncopal episode on 02/27/2023 when he was looking up and following a jet.  He passed out when he did this and struck his head and flat of his back.  He had some bruising with this but no long standing headache, depression, mood changes or other problems.  There is no associated chest pain, SOB, palpitations or other problems.  Again, this dizziness only occurs when he goes from a sitting to standing position.  He had a recent preoperative workup where he had an ECHO on 09/22/2022 which showed a normal LV function with a LVEF of 60-65% without wall motion abnormalities but he did have Grade I diastolic dysfunction.  His RV systolic function was normal.  He had an aortic valve that had moderate calcification with moderate thickening with mild AV regurgiation and moderate aortic valve stenosis with AV area of 1.25 cm.  They noted some coronary calcifications on a prior CT scan but had no symptoms of angina at that time.  I had sent him to see Gary Larson whom he saw in 09/2021.  He did not think a coronary calcium score would be helpful but recommended to continue on his statin as this finding on his CT scan was a marker for increased cardiovascular risk.     Past Medical History Past Medical History:  Diagnosis Date   Arthritis    Chronic pain syndrome    Degenerative disc disease, lumbar    Gastritis    GERD (gastroesophageal reflux disease)     History of carpal tunnel release    Hypercholesterolemia    Kidney stones      Allergies Allergies  Allergen Reactions   Morphine Itching     Medications  Current Outpatient Medications:    ALPRAZolam (XANAX XR) 0.5 MG 24 hr tablet, Take 1 tablet (0.5 mg total) by mouth at bedtime as needed for anxiety., Disp: 30 tablet, Rfl: 2   aspirin EC 81 MG tablet, Take 81 mg by mouth daily. Swallow whole., Disp: , Rfl:    atorvastatin (LIPITOR) 80 MG tablet, Take 1 tablet (80 mg total) by mouth at bedtime., Disp: 90 tablet, Rfl: 1   Review of Systems Review of Systems  Constitutional:  Negative for chills and fever.  Eyes:  Negative for blurred vision and double vision.  Respiratory:  Negative for cough and shortness of breath.   Cardiovascular:  Negative for chest pain, palpitations, orthopnea and leg swelling.  Gastrointestinal:  Negative for abdominal pain, constipation, diarrhea, nausea and vomiting.  Neurological:  Positive for dizziness. Negative for weakness and headaches.       Objective:    Vitals BP 126/82   Pulse 83   Temp 98.4 F (36.9 C)   Resp 17   Ht 6\' 1"  (1.854 m)   Wt 191 lb 12.8 oz (87 kg)  SpO2 99%   BMI 25.30 kg/m    Physical Examination Physical Exam Constitutional:      Appearance: Normal appearance. He is not ill-appearing.  HENT:     Head: Normocephalic and atraumatic.     Right Ear: Tympanic membrane, ear canal and external ear normal.     Left Ear: Tympanic membrane, ear canal and external ear normal.     Mouth/Throat:     Mouth: Mucous membranes are moist.     Pharynx: Oropharynx is clear. No oropharyngeal exudate or posterior oropharyngeal erythema.  Eyes:     General: No scleral icterus.    Extraocular Movements: Extraocular movements intact.     Conjunctiva/sclera: Conjunctivae normal.     Pupils: Pupils are equal, round, and reactive to light.  Cardiovascular:     Rate and Rhythm: Normal rate and regular rhythm.     Pulses: Normal  pulses.     Heart sounds: No murmur heard.    No friction rub. No gallop.  Pulmonary:     Effort: Pulmonary effort is normal. No respiratory distress.     Breath sounds: No wheezing, rhonchi or rales.  Abdominal:     General: Abdomen is flat. Bowel sounds are normal. There is no distension.     Palpations: Abdomen is soft.     Tenderness: There is no abdominal tenderness.  Musculoskeletal:     Right lower leg: No edema.     Left lower leg: No edema.  Skin:    General: Skin is warm and dry.     Findings: No rash.  Neurological:     Mental Status: He is alert.     Comments: CN II-XII are fully intact, strength is 5/5 throughout        Assessment & Plan:   Syncope and collapse His neuro exam is completely normal.  I did an EKG on him and this shows NSR without any ST/T wave changes.  I also did orthostatic on him and his BP and HR remained normal.  I am going to obtain a zio patch on him at this time.  If this is normal, we will refer him to Gary Larson for evaluation and to see if we can get him setup for a tilt table test.    No follow-ups on file.   Crist Fat, MD

## 2023-03-27 ENCOUNTER — Other Ambulatory Visit: Payer: Self-pay | Admitting: Internal Medicine

## 2023-03-27 ENCOUNTER — Other Ambulatory Visit: Payer: Self-pay

## 2023-03-27 ENCOUNTER — Telehealth: Payer: Self-pay | Admitting: Cardiology

## 2023-03-27 ENCOUNTER — Ambulatory Visit: Payer: Medicare Other | Attending: Cardiology

## 2023-03-27 DIAGNOSIS — R001 Bradycardia, unspecified: Secondary | ICD-10-CM | POA: Diagnosis not present

## 2023-03-27 DIAGNOSIS — G4700 Insomnia, unspecified: Secondary | ICD-10-CM

## 2023-03-27 MED ORDER — ALPRAZOLAM ER 0.5 MG PO TB24
0.5000 mg | ORAL_TABLET | Freq: Every evening | ORAL | 2 refills | Status: DC | PRN
Start: 2023-03-27 — End: 2023-04-04

## 2023-03-27 NOTE — Telephone Encounter (Signed)
Patient states he is returning a call to schedule a monitor appointment.

## 2023-03-27 NOTE — Telephone Encounter (Signed)
Patient came to office, order being placed and monitor being put on/kbl 03/27/23

## 2023-04-04 ENCOUNTER — Other Ambulatory Visit: Payer: Self-pay | Admitting: Internal Medicine

## 2023-04-04 DIAGNOSIS — G4700 Insomnia, unspecified: Secondary | ICD-10-CM

## 2023-04-04 MED ORDER — ALPRAZOLAM ER 0.5 MG PO TB24
0.5000 mg | ORAL_TABLET | Freq: Every evening | ORAL | 2 refills | Status: DC | PRN
Start: 2023-04-04 — End: 2023-04-09

## 2023-04-09 ENCOUNTER — Other Ambulatory Visit: Payer: Self-pay | Admitting: Internal Medicine

## 2023-04-09 DIAGNOSIS — G4700 Insomnia, unspecified: Secondary | ICD-10-CM

## 2023-04-09 MED ORDER — ALPRAZOLAM ER 0.5 MG PO TB24
0.5000 mg | ORAL_TABLET | Freq: Every evening | ORAL | 2 refills | Status: DC | PRN
Start: 2023-04-09 — End: 2023-06-26

## 2023-04-13 DIAGNOSIS — Z23 Encounter for immunization: Secondary | ICD-10-CM | POA: Diagnosis not present

## 2023-04-13 DIAGNOSIS — R001 Bradycardia, unspecified: Secondary | ICD-10-CM | POA: Diagnosis not present

## 2023-05-08 ENCOUNTER — Encounter: Payer: Self-pay | Admitting: Internal Medicine

## 2023-06-08 ENCOUNTER — Encounter: Payer: Self-pay | Admitting: Internal Medicine

## 2023-06-08 ENCOUNTER — Ambulatory Visit: Payer: Medicare Other | Admitting: Internal Medicine

## 2023-06-08 VITALS — BP 116/72 | HR 94 | Temp 98.5°F | Resp 17 | Ht 72.0 in | Wt 192.2 lb

## 2023-06-08 DIAGNOSIS — R3911 Hesitancy of micturition: Secondary | ICD-10-CM

## 2023-06-08 DIAGNOSIS — I350 Nonrheumatic aortic (valve) stenosis: Secondary | ICD-10-CM | POA: Diagnosis not present

## 2023-06-08 DIAGNOSIS — M150 Primary generalized (osteo)arthritis: Secondary | ICD-10-CM | POA: Insufficient documentation

## 2023-06-08 DIAGNOSIS — G894 Chronic pain syndrome: Secondary | ICD-10-CM

## 2023-06-08 DIAGNOSIS — J302 Other seasonal allergic rhinitis: Secondary | ICD-10-CM

## 2023-06-08 DIAGNOSIS — K2950 Unspecified chronic gastritis without bleeding: Secondary | ICD-10-CM

## 2023-06-08 DIAGNOSIS — R55 Syncope and collapse: Secondary | ICD-10-CM

## 2023-06-08 DIAGNOSIS — Z1331 Encounter for screening for depression: Secondary | ICD-10-CM | POA: Diagnosis not present

## 2023-06-08 DIAGNOSIS — K2100 Gastro-esophageal reflux disease with esophagitis, without bleeding: Secondary | ICD-10-CM

## 2023-06-08 DIAGNOSIS — Z1159 Encounter for screening for other viral diseases: Secondary | ICD-10-CM

## 2023-06-08 DIAGNOSIS — E7800 Pure hypercholesterolemia, unspecified: Secondary | ICD-10-CM

## 2023-06-08 DIAGNOSIS — Z6826 Body mass index (BMI) 26.0-26.9, adult: Secondary | ICD-10-CM

## 2023-06-08 DIAGNOSIS — I2510 Atherosclerotic heart disease of native coronary artery without angina pectoris: Secondary | ICD-10-CM

## 2023-06-08 DIAGNOSIS — Z0000 Encounter for general adult medical examination without abnormal findings: Secondary | ICD-10-CM

## 2023-06-08 DIAGNOSIS — G4700 Insomnia, unspecified: Secondary | ICD-10-CM

## 2023-06-08 DIAGNOSIS — K219 Gastro-esophageal reflux disease without esophagitis: Secondary | ICD-10-CM

## 2023-06-08 DIAGNOSIS — M5136 Other intervertebral disc degeneration, lumbar region with discogenic back pain only: Secondary | ICD-10-CM | POA: Diagnosis not present

## 2023-06-08 HISTORY — DX: Nonrheumatic aortic (valve) stenosis: I35.0

## 2023-06-08 HISTORY — DX: Other seasonal allergic rhinitis: J30.2

## 2023-06-08 HISTORY — DX: Body mass index (BMI) 26.0-26.9, adult: Z68.26

## 2023-06-08 HISTORY — DX: Primary generalized (osteo)arthritis: M15.0

## 2023-06-08 HISTORY — DX: Syncope and collapse: R55

## 2023-06-08 NOTE — Assessment & Plan Note (Signed)
He is no longer seeing the pain clinic.  I want him to keep active with exercise.  He can use tylenol prn.

## 2023-06-08 NOTE — Assessment & Plan Note (Signed)
His ECHO was noted.  We will send him to cardiology for his history of syncope.  He is not having any symptoms today of presyncope or dizziness.

## 2023-06-08 NOTE — Assessment & Plan Note (Signed)
He is currently doing well at this time with his insomnina.

## 2023-06-08 NOTE — Assessment & Plan Note (Signed)
He is not on any PPI but denies any symptoms of reflux today.

## 2023-06-08 NOTE — Assessment & Plan Note (Signed)
Plan as above.  

## 2023-06-08 NOTE — Assessment & Plan Note (Signed)
He has coronary calcifications seen on his CT scan where his goal LDL should be <70.  We will continue with risk factor modification.

## 2023-06-08 NOTE — Assessment & Plan Note (Signed)
Continue OTC meds as needed.

## 2023-06-08 NOTE — Assessment & Plan Note (Signed)
We just changed his cholesterol meds and we will recheck his FLP today.

## 2023-06-08 NOTE — Assessment & Plan Note (Addendum)
I am going to refer him to Dr. Dulce Sellar to see if we can get a tilt table test done on him.  He also has a history of aortic stenosis and will need followup on this as well.

## 2023-06-08 NOTE — Assessment & Plan Note (Signed)
Continue to eat healthy and exercise and keep active.
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
## 2023-06-08 NOTE — Assessment & Plan Note (Signed)
Again, he stopped his chronic pain meds and uses tylenol as needed.

## 2023-06-09 LAB — CBC WITH DIFFERENTIAL/PLATELET
Basophils Absolute: 0.1 10*3/uL (ref 0.0–0.2)
Basos: 1 %
EOS (ABSOLUTE): 0.1 10*3/uL (ref 0.0–0.4)
Eos: 2 %
Hematocrit: 44.3 % (ref 37.5–51.0)
Hemoglobin: 14.6 g/dL (ref 13.0–17.7)
Immature Grans (Abs): 0 10*3/uL (ref 0.0–0.1)
Immature Granulocytes: 0 %
Lymphocytes Absolute: 1.6 10*3/uL (ref 0.7–3.1)
Lymphs: 24 %
MCH: 33.3 pg — ABNORMAL HIGH (ref 26.6–33.0)
MCHC: 33 g/dL (ref 31.5–35.7)
MCV: 101 fL — ABNORMAL HIGH (ref 79–97)
Monocytes Absolute: 0.9 10*3/uL (ref 0.1–0.9)
Monocytes: 13 %
Neutrophils Absolute: 4.1 10*3/uL (ref 1.4–7.0)
Neutrophils: 60 %
Platelets: 335 10*3/uL (ref 150–450)
RBC: 4.38 x10E6/uL (ref 4.14–5.80)
RDW: 11.9 % (ref 11.6–15.4)
WBC: 6.7 10*3/uL (ref 3.4–10.8)

## 2023-06-09 LAB — LIPID PANEL
Chol/HDL Ratio: 2.7 ratio (ref 0.0–5.0)
Cholesterol, Total: 150 mg/dL (ref 100–199)
HDL: 55 mg/dL (ref >39)
LDL Chol Calc (NIH): 78 mg/dL (ref 0–99)
Triglycerides: 94 mg/dL (ref 0–149)
VLDL Cholesterol Cal: 17 mg/dL (ref 5–40)

## 2023-06-09 LAB — CMP14 + ANION GAP
ALT: 56 [IU]/L — ABNORMAL HIGH (ref 0–44)
AST: 38 [IU]/L (ref 0–40)
Albumin: 4.6 g/dL (ref 3.8–4.8)
Alkaline Phosphatase: 163 [IU]/L — ABNORMAL HIGH (ref 44–121)
Anion Gap: 16 mmol/L (ref 10.0–18.0)
BUN/Creatinine Ratio: 17 (ref 10–24)
BUN: 18 mg/dL (ref 8–27)
Bilirubin Total: 0.4 mg/dL (ref 0.0–1.2)
CO2: 22 mmol/L (ref 20–29)
Calcium: 10.1 mg/dL (ref 8.6–10.2)
Chloride: 104 mmol/L (ref 96–106)
Creatinine, Ser: 1.05 mg/dL (ref 0.76–1.27)
Globulin, Total: 2.8 g/dL (ref 1.5–4.5)
Glucose: 102 mg/dL — ABNORMAL HIGH (ref 70–99)
Potassium: 5.5 mmol/L — ABNORMAL HIGH (ref 3.5–5.2)
Sodium: 142 mmol/L (ref 134–144)
Total Protein: 7.4 g/dL (ref 6.0–8.5)
eGFR: 76 mL/min/{1.73_m2} (ref >59)

## 2023-06-09 LAB — PSA: Prostate Specific Ag, Serum: 1.3 ng/mL (ref 0.0–4.0)

## 2023-06-09 LAB — HEPATITIS C ANTIBODY: Hep C Virus Ab: NONREACTIVE

## 2023-06-09 LAB — TSH: TSH: 1.39 u[IU]/mL (ref 0.450–4.500)
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
## 2023-06-13 ENCOUNTER — Encounter: Payer: Self-pay | Admitting: Cardiology

## 2023-06-13 ENCOUNTER — Ambulatory Visit: Payer: Medicare Other | Attending: Cardiology | Admitting: Cardiology

## 2023-06-13 VITALS — BP 80/62 | HR 72 | Ht 72.0 in | Wt 196.2 lb

## 2023-06-13 DIAGNOSIS — I350 Nonrheumatic aortic (valve) stenosis: Secondary | ICD-10-CM | POA: Diagnosis not present

## 2023-06-13 DIAGNOSIS — E782 Mixed hyperlipidemia: Secondary | ICD-10-CM | POA: Insufficient documentation

## 2023-06-13 DIAGNOSIS — R55 Syncope and collapse: Secondary | ICD-10-CM | POA: Diagnosis not present

## 2023-06-13 NOTE — Patient Instructions (Signed)
Medication Instructions:  Your physician recommends that you continue on your current medications as directed. Please refer to the Current Medication list given to you today.  *If you need a refill on your cardiac medications before your next appointment, please call your pharmacy*   Lab Work: None If you have labs (blood work) drawn today and your tests are completely normal, you will receive your results only by: MyChart Message (if you have MyChart) OR A paper copy in the mail If you have any lab test that is abnormal or we need to change your treatment, we will call you to review the results.   Testing/Procedures: Your physician has requested that you have an echocardiogram. Echocardiography is a painless test that uses sound waves to create images of your heart. It provides your doctor with information about the size and shape of your heart and how well your heart's chambers and valves are working. This procedure takes approximately one hour. There are no restrictions for this procedure. Please do NOT wear cologne, perfume, aftershave, or lotions (deodorant is allowed). Please arrive 15 minutes prior to your appointment time.  Please note: We ask at that you not bring children with you during ultrasound (echo/ vascular) testing. Due to room size and safety concerns, children are not allowed in the ultrasound rooms during exams. Our front office staff cannot provide observation of children in our lobby area while testing is being conducted. An adult accompanying a patient to their appointment will only be allowed in the ultrasound room at the discretion of the ultrasound technician under special circumstances. We apologize for any inconvenience.    Follow-Up: At [facility], you and your health needs are our priority.  As part of our continuing mission to provide you with exceptional heart care, we have created designated Provider Care Teams.  These Care Teams include your  primary Cardiologist (physician) and Advanced Practice Providers (APPs -  Physician Assistants and Nurse Practitioners) who all work together to provide you with the care you need, when you need it.  We recommend signing up for the patient portal called "MyChart".  Sign up information is provided on this After Visit Summary.  MyChart is used to connect with patients for Virtual Visits (Telemedicine).  Patients are able to view lab/test results, encounter notes, upcoming appointments, etc.  Non-urgent messages can be sent to your provider as well.   To learn more about what you can do with MyChart, go to [email or web address].    Your next appointment:   1 year(s)  Provider:   Norman Herrlich, MD    Other Instructions None

## 2023-06-26 ENCOUNTER — Other Ambulatory Visit: Payer: Self-pay | Admitting: Internal Medicine

## 2023-06-26 DIAGNOSIS — G4700 Insomnia, unspecified: Secondary | ICD-10-CM

## 2023-06-26 MED ORDER — ALPRAZOLAM ER 0.5 MG PO TB24: 0.5000 mg | ORAL_TABLET | Freq: Every evening | ORAL | 2 refills | Status: DC | PRN

## 2023-07-02 ENCOUNTER — Other Ambulatory Visit: Payer: Self-pay | Admitting: Internal Medicine

## 2023-07-02 DIAGNOSIS — G4700 Insomnia, unspecified: Secondary | ICD-10-CM

## 2023-07-02 MED ORDER — ALPRAZOLAM ER 0.5 MG PO TB24: 0.5000 mg | ORAL_TABLET | Freq: Every evening | ORAL | 2 refills | Status: DC | PRN

## 2023-07-03 NOTE — Progress Notes (Signed)
His K+ was a bit elevated but all his labs look good.   Patient is aware of labs

## 2023-09-07 ENCOUNTER — Ambulatory Visit (AMBULATORY_SURGERY_CENTER): Payer: Medicare Other

## 2023-09-07 VITALS — Ht 72.0 in | Wt 188.0 lb

## 2023-09-07 DIAGNOSIS — Z800 Family history of malignant neoplasm of digestive organs: Secondary | ICD-10-CM

## 2023-09-07 MED ORDER — SUFLAVE 178.7 G PO SOLR: 1.0000 | ORAL | 0 refills | Status: DC

## 2023-09-07 NOTE — Progress Notes (Signed)
 No egg or soy allergy known to patient  No issues known to pt with past sedation with any surgeries or procedures Patient denies ever being told they had issues or difficulty with intubation  No FH of Malignant Hyperthermia Pt is not on diet pills Pt is not on  home 02  Pt is not on blood thinners  Pt denies issues with constipation  No A fib or A flutter Have any cardiac testing pending--Yes, 09/19/23 echo scheduled. Pt can ambulate  Pt denies use of chewing tobacco Discussed diabetic I weight loss medication holds Discussed NSAID holds Checked BMI Pt instructed to use Single[employer]com or GoodRx for a price reduction on prep  Patient's chart reviewed by Norleen Schillings CNRA prior to previsit and patient appropriate for the [facility].  Pre visit completed and red dot placed by patient's name on their procedure day (on provider's schedule).

## 2023-09-19 ENCOUNTER — Ambulatory Visit: Payer: Medicare Other | Attending: Cardiology

## 2023-09-19 DIAGNOSIS — I350 Nonrheumatic aortic (valve) stenosis: Secondary | ICD-10-CM | POA: Insufficient documentation

## 2023-09-19 DIAGNOSIS — R55 Syncope and collapse: Secondary | ICD-10-CM | POA: Insufficient documentation

## 2023-09-19 DIAGNOSIS — E782 Mixed hyperlipidemia: Secondary | ICD-10-CM | POA: Diagnosis not present

## 2023-09-19 LAB — ECHOCARDIOGRAM COMPLETE
AR max vel: 1.55 cm2
AV Area VTI: 1.69 cm2
AV Area mean vel: 1.63 cm2
AV Mean grad: 18.6 mm[Hg]
AV Peak grad: 37.9 mm[Hg]
Ao pk vel: 3.08 m/s
P 1/2 time: 501 ms
S' Lateral: 2.8 cm

## 2023-09-20 ENCOUNTER — Telehealth: Payer: Self-pay | Admitting: Cardiology

## 2023-09-20 NOTE — Telephone Encounter (Signed)
 Patient notified of results and verbalized understanding.

## 2023-09-20 NOTE — Telephone Encounter (Signed)
 Pt returning nurses call regarding Echo results. Please advise

## 2023-09-21 ENCOUNTER — Telehealth: Payer: Self-pay

## 2023-09-21 NOTE — Telephone Encounter (Signed)
Hi John, This patient had an echo on 2/19, after you cleared him for [facility].  Would you please look over echo, it is noted he now has moderate aortic stenosis. Thank you, Wallace Cogliano

## 2023-10-01 ENCOUNTER — Telehealth: Payer: Self-pay | Admitting: Gastroenterology

## 2023-10-01 NOTE — Telephone Encounter (Signed)
 Pt stated that he is requesting that an EGD to be added on to the time of his colonoscopy. Pt was scheduled for a Colonoscopy on 10/03/2023. Procedure was canceled per  pt request.  Pt stated that off of his EGD results 5 years ago, and his current symptoms he is requesting that an EGD be done at the time of his Colonoscopy. Pt stated that he is having abdominal pain, bloating daily. Pt stated that typically he wakes up every night and around 3 AM with bloating in his stomach.  Please review and advise.

## 2023-10-01 NOTE — Telephone Encounter (Signed)
 Sure thing Please schedule him for double (1hr slot) RG

## 2023-10-01 NOTE — Telephone Encounter (Signed)
 Patient called and stated that they have a colonoscopy coming up this Wednesday and is wanting to know if he can get a EGD done with his colonoscopy. Patient is requesting a call back. Please advise.

## 2023-10-02 ENCOUNTER — Other Ambulatory Visit: Payer: Self-pay

## 2023-10-02 DIAGNOSIS — R109 Unspecified abdominal pain: Secondary | ICD-10-CM

## 2023-10-02 DIAGNOSIS — Z800 Family history of malignant neoplasm of digestive organs: Secondary | ICD-10-CM

## 2023-10-02 DIAGNOSIS — R140 Abdominal distension (gaseous): Secondary | ICD-10-CM

## 2023-10-02 NOTE — Telephone Encounter (Signed)
 Pt was made aware of Dr. Chales Abrahams recommendations: Pt was scheduled for the EGD/ Colon on 11/28/2023 at 8:30 AM with Dr. Chales Abrahams in the [facility]. New prep instructions were created and sent to pt via my chart. Pt made aware. Pt stated that he already has the prep.   New ambulatory referral to GI placed in EPIC. Pt verbalized understanding with all questions answered.

## 2023-10-03 ENCOUNTER — Encounter: Payer: Self-pay | Admitting: Cardiology

## 2023-10-03 ENCOUNTER — Encounter: Payer: Medicare Other | Admitting: Gastroenterology

## 2023-10-03 NOTE — Progress Notes (Deleted)
 Cardiology Office Note:    Date:  10/03/2023   ID:  Gary Larson, DOB [birth year], MRN [id number]  PCP:  Gary Fat, MD  Cardiologist:  Gary Herrlich, MD    Referring MD: Gary Larson, Gary Cower, MD    ASSESSMENT:    No diagnosis found. PLAN:    In order of problems listed above:  ***   Next appointment: ***   Medication Adjustments/Labs and Tests Ordered: Current medicines are reviewed at length with the patient today.  Concerns regarding medicines are outlined above.  No orders of the defined types were placed in this encounter.  No orders of the defined types were placed in this encounter.    History of Present Illness:    Gary Larson is a 73 y.o. male with a hx of mild to moderate aortic stenosis coronary artery calcification on CT scan and hyperlipidemia last seen 06/13/2023.  Following his visit he had a repeat echocardiogram confirming aortic stenosis with mild valvular regurgitation mean gradient of 18.6 mmHg mild aortic stenosis.  There is mild mitral regurgitation no mild stenosis normal left ventricular systolic function GLS and ejection fraction 60 to 65%. 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 therapy 06/01/2016   Common bile duct dilatation 03/14/2018   Coronary artery calcification seen on CAT scan 06/07/2022   Degenerative disc disease, lumbar    Gastritis    Gastroesophageal reflux disease with esophagitis without hemorrhage 06/07/2022   GERD (gastroesophageal reflux disease)    Heart murmur 06/07/2022   History of carpal tunnel release    Hypercalcemia 06/28/2022   Hypercholesterolemia    Irritable bowel syndrome (IBS) 06/01/2016   Kidney stones    Left shoulder pain 05/30/2016   Lumbar back pain 08/02/2022   Opioid use agreement exists 06/01/2016   Primary  osteoarthritis involving multiple joints 06/08/2023   Seasonal allergies 06/08/2023   Spondylolisthesis 08/24/2018   Syncope 06/08/2023   Syncope and collapse 03/26/2023   Tear of left rotator cuff 11/05/2020   Added automatically from request for surgery [id number]     Transaminitis 03/14/2018    Current Medications: No outpatient medications have been marked as taking for the 10/04/23 encounter (Appointment) with Gary Daub, MD.      EKGs/Labs/Other Studies Reviewed:    The following studies were reviewed today:  Cardiac Studies & Procedures   ______________________________________________________________________________________________     ECHOCARDIOGRAM  ECHOCARDIOGRAM COMPLETE 09/19/2023  Narrative ECHOCARDIOGRAM REPORT    Patient Name:   Gary Larson Date of Exam: 09/19/2023 Medical Rec #:  [id number]    Height:       72.0 in Accession #:    [id number]   Weight:       188.0 lb Date of Birth:  05/22/1951     BSA:          2.075 m Patient Age:    73 years     BP:           116/72 mmHg Patient Gender: M            HR:           71 bpm. Exam Location:  [place]  Procedure: 2D Echo and Strain Analysis (Both Spectral and Color Flow Doppler were utilized during procedure).  Indications:    Aortic valve stenosis, etiology of cardiac valve  disease unspecified [I35.0 (ICD-10-CM)]; Syncope and collapse [name] (ICD-10-CM)]; Mixed hyperlipidemia [E78.2 (ICD-10-CM)]  History:        Patient has prior history of Echocardiogram examinations, most recent 09/22/2022. Signs/Symptoms:Syncope and Murmur; Risk Factors:Dyslipidemia.  Sonographer:    Gary Larson RDCS Referring Phys: [id number] Gary Larson  IMPRESSIONS   1. Left ventricular ejection fraction, by estimation, is 60 to 65%. The left ventricle has normal function. The left ventricle has no regional wall motion abnormalities. Left ventricular diastolic parameters are indeterminate. The average left ventricular global  longitudinal strain is -20.8 %. The global longitudinal strain is normal. 2. Right ventricular systolic function is normal. The right ventricular size is normal. There is normal pulmonary artery systolic pressure. The estimated right ventricular systolic pressure is 30.2 mmHg. 3. Mild mitral valve regurgitation. No evidence of mitral stenosis. 4. The aortic valve is tricuspid. There is mild calcification of the aortic valve. There is moderate thickening of the aortic valve. Aortic valve regurgitation is mild. 5. Moderate aortic valve stenosis. Aortic regurgitation PHT measures 501 msec. Aortic valve area, by VTI measures 1.69 cm. Aortic valve mean gradient measures 18.6 mmHg. Aortic valve Vmax measures 3.08 m/s. 6. There is mild dilatation of the ascending aorta, measuring 38 mm. 7. The inferior vena cava is normal in size with greater than 50% respiratory variability, suggesting right atrial pressure of 3 mmHg.  Comparison(s): In comparison to prior echocardiogram from 09-22-2022 reported LVEF 60 to 65%, GLS -19.9%, moderate aortic stenosis V-max 2.8 m/s, mean gradient 17 mmHg, valve area calculated 1.25 cm, mild aortic insufficiency.  FINDINGS Left Ventricle: Left ventricular ejection fraction, by estimation, is 60 to 65%. The left ventricle has normal function. The left ventricle has no regional wall motion abnormalities. The average left ventricular global longitudinal strain is -20.8 %. Strain was performed and the global longitudinal strain is normal. The left ventricular internal cavity size was normal in size. There is no left ventricular hypertrophy. Left ventricular diastolic parameters are indeterminate.  Right Ventricle: The right ventricular size is normal. Right vetricular wall thickness was not well visualized. Right ventricular systolic function is normal. There is normal pulmonary artery systolic pressure. The tricuspid regurgitant velocity is 2.61 m/s, and with an assumed right  atrial pressure of 3 mmHg, the estimated right ventricular systolic pressure is 30.2 mmHg.  Left Atrium: Left atrial size was normal in size.  Right Atrium: Right atrial size was normal in size.  Pericardium: There is no evidence of pericardial effusion.  Mitral Valve: The mitral valve is grossly normal. Mild mitral valve regurgitation. No evidence of mitral valve stenosis.  Tricuspid Valve: The tricuspid valve is grossly normal. Tricuspid valve regurgitation is mild . No evidence of tricuspid stenosis.  Aortic Valve: The aortic valve is tricuspid. There is mild calcification of the aortic valve. There is moderate thickening of the aortic valve. Aortic valve regurgitation is mild. Aortic regurgitation PHT measures 501 msec. Moderate aortic stenosis is present. Aortic valve mean gradient measures 18.6 mmHg. Aortic valve peak gradient measures 37.9 mmHg. Aortic valve area, by VTI measures 1.69 cm.  Pulmonic Valve: The pulmonic valve was not well visualized. Pulmonic valve regurgitation is not visualized. No evidence of pulmonic stenosis.  Aorta: The aortic root is normal in size and structure. There is mild dilatation of the ascending aorta, measuring 38 mm.  Venous: The inferior vena cava is normal in size with greater than 50% respiratory variability, suggesting right atrial pressure of 3 mmHg.  IAS/Shunts: The interatrial septum was  not well visualized.  Additional Comments: 3D imaging was not performed.   LEFT VENTRICLE PLAX 2D LVIDd:         4.20 cm   Diastology LVIDs:         2.80 cm   LV e' medial:    7.72 cm/s LV PW:         0.90 cm   LV E/e' medial:  8.1 LV IVS:        0.90 cm   LV e' lateral:   9.46 cm/s LVOT diam:     2.32 cm   LV E/e' lateral: 6.6 LV SV:         103 LV SV Index:   50        2D Longitudinal Strain LVOT Area:     4.24 cm  2D Strain GLS Avg:     -20.8 %   RIGHT VENTRICLE             IVC RV Basal diam:  3.60 cm     IVC diam: 1.60 cm RV S prime:      18.60 cm/s TAPSE (M-mode): 3.0 cm  LEFT ATRIUM           Index        RIGHT ATRIUM           Index LA diam:      3.30 cm 1.59 cm/m   RA Area:     15.95 cm LA Vol (A4C): 44.7 ml 21.54 ml/m  RA Volume:   41.10 ml  19.80 ml/m AORTIC VALVE AV Area (Vmax):    1.55 cm AV Area (Vmean):   1.63 cm AV Area (VTI):     1.69 cm AV Vmax:           308.00 cm/s AV Vmean:          196.200 cm/s AV VTI:            0.608 m AV Peak Grad:      37.9 mmHg AV Mean Grad:      18.6 mmHg LVOT Vmax:         112.50 cm/s LVOT Vmean:        75.500 cm/s LVOT VTI:          0.243 m LVOT/AV VTI ratio: 0.40 AI PHT:            501 msec  AORTA Ao Root diam: 3.50 cm Ao Asc diam:  3.90 cm Ao Desc diam: 2.85 cm  MV E velocity: 62.30 cm/s  TRICUSPID VALVE MV A velocity: 60.40 cm/s  TR Peak grad:   27.2 mmHg MV E/A ratio:  1.03        TR Vmax:        261.00 cm/s  SHUNTS Systemic VTI:  0.24 m Systemic Diam: 2.32 cm  Sreedhar reddy Madireddy Electronically signed by Vern Claude reddy Madireddy Signature Date/Time: 09/19/2023/1:29:47 PM    Final    MONITORS  LONG TERM MONITOR (3-14 DAYS) 04/18/2023  Narrative Patch Wear Time:  13 days and 16 hours (2024-08-27T16:20:31-0400 to 2024-09-10T09:13:53-0400)  Patient had a min HR of 45 bpm, max HR of 137 bpm, and avg HR of 76 bpm.  There were 2 triggered and 1 diary events all sinus rhythm 1 associated with a single atrial premature contraction.  There were no sinus pauses of 3 seconds or greater and no episodes of second or third-degree AV nodal block.  There were no episodes of atrial fibrillation or flutter.  Predominant underlying rhythm was Sinus Rhythm.  Isolated SVEs were rare (<1.0%), SVE Couplets were rare (<1.0%), and SVE Triplets were rare (<1.0%).  Isolated VEs were rare (<1.0%), VE Couplets were rare (<1.0%), and no VE Triplets were present.        ______________________________________________________________________________________________          Recent Labs: 06/08/2023: ALT 56; BUN 18; Creatinine, Ser 1.05; Hemoglobin 14.6; Platelets 335; Potassium 5.5; Sodium 142; TSH 1.390  Recent Lipid Panel    Component Value Date/Time   CHOL 150 06/08/2023 1111   TRIG 94 06/08/2023 1111   HDL 55 06/08/2023 1111   CHOLHDL 2.7 06/08/2023 1111   LDLCALC 78 06/08/2023 1111    Physical Exam:    VS:  There were no vitals taken for this visit.    Wt Readings from Last 3 Encounters:  09/07/23 188 lb (85.3 kg)  06/13/23 196 lb 3.2 oz (89 kg)  06/08/23 192 lb 3.2 oz (87.2 kg)     GEN: *** Well nourished, well developed in no acute distress HEENT: Normal NECK: No JVD; No carotid bruits LYMPHATICS: No lymphadenopathy CARDIAC: ***RRR, no murmurs, rubs, gallops RESPIRATORY:  Clear to auscultation without rales, wheezing or rhonchi  ABDOMEN: Soft, non-tender, non-distended MUSCULOSKELETAL:  No edema; No deformity  SKIN: Warm and dry NEUROLOGIC:  Alert and oriented x 3 PSYCHIATRIC:  Normal affect    Signed, Gary Herrlich, MD  10/03/2023 12:02 PM    [facility]

## 2023-10-04 ENCOUNTER — Ambulatory Visit: Payer: Medicare Other | Admitting: Cardiology

## 2023-10-04 ENCOUNTER — Ambulatory Visit

## 2023-10-04 VITALS — BP 106/64 | HR 80 | Ht 72.0 in | Wt 195.6 lb

## 2023-10-04 DIAGNOSIS — R55 Syncope and collapse: Secondary | ICD-10-CM

## 2023-10-04 DIAGNOSIS — I350 Nonrheumatic aortic (valve) stenosis: Secondary | ICD-10-CM

## 2023-10-04 DIAGNOSIS — I2510 Atherosclerotic heart disease of native coronary artery without angina pectoris: Secondary | ICD-10-CM | POA: Diagnosis not present

## 2023-10-04 DIAGNOSIS — I340 Nonrheumatic mitral (valve) insufficiency: Secondary | ICD-10-CM | POA: Insufficient documentation

## 2023-10-04 HISTORY — DX: Nonrheumatic mitral (valve) insufficiency: I34.0

## 2023-10-04 NOTE — Progress Notes (Signed)
 Cardiology Consultation:    Date:  10/04/2023   ID:  Gary Larson, DOB 1950-09-27, MRN [id number]  PCP:  Crist Fat, MD  Cardiologist:  Marlyn Corporal Camree Wigington, MD   Referring MD: Crist Fat, MD   No chief complaint on file.    ASSESSMENT AND PLAN:   Mr. Kirksey with history of aortic stenosis, aortic insufficiency, mitral vegetation, murmur noted to 2 cm, coronary artery calcification on prior CT imaging of the chest, hyperlipidemia, and an episode of syncope in August 2024 and symptoms of lightheadedness with sudden postural changes and certain neck movements he had a follow-up and discuss echocardiogram results.  Problem List Items Addressed This Visit     Coronary artery calcification seen on CAT scan   Continue with aspirin 81 mg once daily. Continue with lipid-lowering therapy, remains on atorvastatin 80 mg once daily tolerating well.       Syncope and collapse - Primary   Difficult to distinguish sharp radiating systolic murmur to the neck results from any carotid bruit. His symptoms of lightheadedness appeared related to positional changes.  He also describes lightheadedness when he changes his head position tilting his neck back.  Will obtain ultrasound carotid to rule out any significant carotid artery disease.      Relevant Orders   ECHOCARDIOGRAM COMPLETE   VAS US CAROTID   Aortic valve stenosis   Moderate aortic stenosis associated with mild aortic insufficiency.  V-max 3 m/s, mean gradient 18 mmHg.  Reviewed natural progression of aortic stenosis. Discussed indications for intervention notably with severe aortic stenosis on with moderate aortic stenosis with any other cardiac surgery being planned.  At this time given his postural lightheadedness symptoms and relatively heavy calcification of the aortic valve and ascending aorta dilatation noted, will follow-up with repeat echocardiogram tentatively in 6 months for any interval change.        Mild mitral  regurgitation   Mild regurgitation. Reviewed the findings and to follow-up with repeat echocardiogram images at regular intervals.         History of Present Illness:    Gary Larson is a 73 y.o. male who is being seen today for follow-up visit. Prior visit with Dr. Dulce Sellar was 06/13/2023. PCP is Leonia Reader, Barbara Cower, MD.   Moderate aortic stenosis, mild aortic insufficiency, mild mitral regurgitation, mildly dilated ascending aorta measuring 3.8 cm,coronary artery calcification on CT scan, hyperlipidemia, an episode of syncope in August after being startled felt to be vasovagal.  Here for the visit accompanied by his wife.  Here to discuss the echocardiogram results done recently.   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  Echocardiogram February 2025 with LVEF normal 60 to 65%, diastolic function indeterminate but global longitudinal strain was -20.8%, RV function normal, mild MR, moderate aortic stenosis was reported with V-max 3 m/s and mean gradient 18 mmHg, ascending aorta mildly dilated 3.8 cm.  He does not have any ongoing active symptoms.  Describes sensation of lightheadedness when he tilts his head.  No further syncopal episodes since syncope out in the field while he was watching jet airplanes fly over.  Mentions he does get lightheaded when standing changes position and hence takes his time.  Has not sustained any further falls or syncopal episodes.  Denies any chest Larson.  Does walk up to 2 miles with his wife regularly.  No blood in urine or stools. 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 06/01/2016   Common bile duct dilatation 03/14/2018   Coronary artery calcification seen on CAT scan 06/07/2022   Degenerative disc disease, lumbar    Gastritis    Gastroesophageal reflux disease with esophagitis  without hemorrhage 06/07/2022   GERD (gastroesophageal reflux disease)    Heart murmur 06/07/2022   History of carpal tunnel release    Hypercalcemia 06/28/2022   Hypercholesterolemia    Irritable bowel syndrome (IBS) 06/01/2016   Kidney stones    Left shoulder Larson 05/30/2016   Lumbar back Larson 08/02/2022   Opioid use agreement exists 06/01/2016   Primary osteoarthritis involving multiple joints 06/08/2023   Seasonal allergies 06/08/2023   Spondylolisthesis 08/24/2018   Syncope 06/08/2023   Syncope and collapse 03/26/2023   Tear of left rotator cuff 11/05/2020   Added automatically from request for surgery [id number]     Transaminitis 03/14/2018    Past Surgical History:  Procedure Laterality Date   CARPAL TUNNEL RELEASE Right    CHOLECYSTECTOMY  03/14/2018   COLONOSCOPY WITH ESOPHAGOGASTRODUODENOSCOPY (EGD)  2019   SHOULDER SURGERY Bilateral    5 times resected    Current Medications: Current Meds  Medication Sig   ALPRAZolam (XANAX XR) 0.5 MG 24 hr tablet Take 1 tablet (0.5 mg total) by mouth at bedtime as needed for anxiety.   aspirin EC 81 MG tablet Take 81 mg by mouth daily. Swallow whole.   atorvastatin (LIPITOR) 80 MG tablet Take 1 tablet (80 mg total) by mouth at bedtime.   diphenhydrAMINE (BENADRYL) 25 mg capsule Take 25 mg by mouth at bedtime.   ibuprofen (ADVIL) 200 MG tablet Take 200 mg by mouth at bedtime.   PEG 3350-KCl-NaCl-NaSulf-MgSul (SUFLAVE) 178.7 g SOLR Take 1 kit by mouth as directed.     Allergies:   Morphine   Social History   Socioeconomic History   Marital status: Married    Spouse name: Jasmine December   Number of children: 0   Years of education: Not on file   Highest education level: Not on file  Occupational History   Occupation: Retired   Tobacco Use   Smoking status: Never    Passive exposure: Never   Smokeless tobacco: Never  Vaping Use   Vaping status: Never Used  Substance and Sexual Activity   Alcohol use: Not Currently   Drug use:  Never   Sexual activity: Not on file  Other Topics Concern   Not on file  Social History Narrative   Not on file   Social Drivers of Health   Financial Resource Strain: Not on file  Food Insecurity: Not on file  Transportation Needs: Not on file  Physical Activity: Not on file  Stress: Not on file  Social Connections: Not on file     Family History: The patient's family history includes Breast cancer in his sister; Colon cancer in his brother and sister; Diabetes in his brother and brother; Esophageal cancer in his brother; Stomach cancer in his brother. There is no history of Colon polyps or Rectal cancer. ROS:   Please see the history of present illness.    All 14 point review of systems negative except as described per history of present illness.  EKGs/Labs/Other Studies Reviewed:    The following studies were reviewed today:   EKG:       Recent Labs: 06/08/2023: ALT 56; BUN 18; Creatinine, Ser 1.05; Hemoglobin 14.6; Platelets  335; Potassium 5.5; Sodium 142; TSH 1.390  Recent Lipid Panel    Component Value Date/Time   CHOL 150 06/08/2023 1111   TRIG 94 06/08/2023 1111   HDL 55 06/08/2023 1111   CHOLHDL 2.7 06/08/2023 1111   LDLCALC 78 06/08/2023 1111    Physical Exam:    VS:  BP 106/64   Pulse 80   Ht 6' (1.829 m)   Wt 195 lb 9.6 oz (88.7 kg)   SpO2 99%   BMI 26.53 kg/m     Wt Readings from Last 3 Encounters:  10/04/23 195 lb 9.6 oz (88.7 kg)  09/07/23 188 lb (85.3 kg)  06/13/23 196 lb 3.2 oz (89 kg)     GENERAL:  Well nourished, well developed in no acute distress NECK: No JVD; No carotid bruits CARDIAC: RRR, S1 and S2 present, 4/6 ejection systolic murmur heard all over the precordium and radiating to bilateral neck vessels. CHEST:  Clear to auscultation without rales, wheezing or rhonchi  Extremities: No pitting pedal edema. Pulses bilaterally symmetric with radial 2+ and dorsalis pedis 2+ NEUROLOGIC:  Alert and oriented x 3  Medication  Adjustments/Labs and Tests Ordered: Current medicines are reviewed at length with the patient today.  Concerns regarding medicines are outlined above.  Orders Placed This Encounter  Procedures   ECHOCARDIOGRAM COMPLETE   VAS US CAROTID   No orders of the defined types were placed in this encounter.   Signed, Cecille Amsterdam, MD, MPH, [facility]. 10/04/2023 2:01 PM    [facility]

## 2023-10-04 NOTE — Assessment & Plan Note (Signed)
 Mild regurgitation. Reviewed the findings and to follow-up with repeat echocardiogram images at regular intervals.

## 2023-10-04 NOTE — Assessment & Plan Note (Signed)
 Moderate aortic stenosis associated with mild aortic insufficiency.  V-max 3 m/s, mean gradient 18 mmHg.  Reviewed natural progression of aortic stenosis. Discussed indications for intervention notably with severe aortic stenosis on with moderate aortic stenosis with any other cardiac surgery being planned.  At this time given his postural lightheadedness symptoms and relatively heavy calcification of the aortic valve and ascending aorta dilatation noted, will follow-up with repeat echocardiogram tentatively in 6 months for any interval change.

## 2023-10-04 NOTE — Assessment & Plan Note (Signed)
 Continue with aspirin 81 mg once daily. Continue with lipid-lowering therapy, remains on atorvastatin 80 mg once daily tolerating well.

## 2023-10-04 NOTE — Assessment & Plan Note (Signed)
 Difficult to distinguish sharp radiating systolic murmur to the neck results from any carotid bruit. His symptoms of lightheadedness appeared related to positional changes.  He also describes lightheadedness when he changes his head position tilting his neck back.  Will obtain ultrasound carotid to rule out any significant carotid artery disease.

## 2023-10-04 NOTE — Patient Instructions (Addendum)
 Medication Instructions:   No New Orders    Lab Work: None Ordered If you have labs (blood work) drawn today and your tests are completely normal, you will receive your results only by: MyChart Message (if you have MyChart) OR A paper copy in the mail If you have any lab test that is abnormal or we need to change your treatment, we will call you to review the results.   Testing/Procedures: Your physician has requested that you have a carotid duplex. This test is an ultrasound of the carotid arteries in your neck. It looks at blood flow through these arteries that supply the brain with blood. Allow one hour for this exam. There are no restrictions or special instructions.    6 months Your physician has requested that you have an echocardiogram. Echocardiography is a painless test that uses sound waves to create images of your heart. It provides your doctor with information about the size and shape of your heart and how well your heart's chambers and valves are working. This procedure takes approximately one hour. There are no restrictions for this procedure. Please do NOT wear cologne, perfume, aftershave, or lotions (deodorant is allowed). Please arrive 15 minutes prior to your appointment time.  Please note: We ask at that you not bring children with you during ultrasound (echo/ vascular) testing. Due to room size and safety concerns, children are not allowed in the ultrasound rooms during exams. Our front office staff cannot provide observation of children in our lobby area while testing is being conducted. An adult accompanying a patient to their appointment will only be allowed in the ultrasound room at the discretion of the ultrasound technician under special circumstances. We apologize for any inconvenience.    Follow-Up: At [facility], you and your health needs are our priority.  As part of our continuing mission to provide you with exceptional heart care, we have created  designated Provider Care Teams.  These Care Teams include your primary Cardiologist (physician) and Advanced Practice Providers (APPs -  Physician Assistants and Nurse Practitioners) who all work together to provide you with the care you need, when you need it.  We recommend signing up for the patient portal called "MyChart".  Sign up information is provided on this After Visit Summary.  MyChart is used to connect with patients for Virtual Visits (Telemedicine).  Patients are able to view lab/test results, encounter notes, upcoming appointments, etc.  Non-urgent messages can be sent to your provider as well.   To learn more about what you can do with MyChart, go to [email or web address].    Your next appointment:   6 month(s)  The format for your next appointment:   In Person  Provider:   Huntley Dec, MD    Other Instructions NA

## 2023-10-15 ENCOUNTER — Telehealth: Payer: Self-pay | Admitting: Gastroenterology

## 2023-10-15 NOTE — Telephone Encounter (Signed)
 PT wife is calling about prep instructions. She advised that the procedure was rescheduled and PT never received an update. Please advise.

## 2023-10-15 NOTE — Telephone Encounter (Signed)
 Informed a copy witll be sent to My Chart and one mailed to address in profile. No other qeustions at this time.

## 2023-10-18 ENCOUNTER — Ambulatory Visit

## 2023-10-18 DIAGNOSIS — R55 Syncope and collapse: Secondary | ICD-10-CM | POA: Diagnosis not present

## 2023-11-21 ENCOUNTER — Telehealth: Payer: Self-pay | Admitting: Gastroenterology

## 2023-11-21 NOTE — Telephone Encounter (Signed)
 Pt called in stating that he typically takes Amoxicillin  500 Mg prophylactically  prior to any surgery due to  possible increase in any risk for infection do to having a calcified aortic valve. Pt has Endo/Colon on 11/28/2023 at 8:30 AM.  Please review and advise.

## 2023-11-21 NOTE — Telephone Encounter (Signed)
 Patient called and stated that he has a procedure scheduled for April the 30 th but he is wanting to know if Dr. Venice Gillis would recommend him taking amoxicillin  early that morning before his procedure due to him having a calcified aortic valve. Patient is requesting a call back. Please advise.

## 2023-11-23 NOTE — Telephone Encounter (Signed)
 No need for GI procedures as per cardiology and GI recommendations. RG

## 2023-11-23 NOTE — Telephone Encounter (Signed)
Pt notified of Dr. Gupta recommendations:  Pt verbalized understanding with all questions answered.   

## 2023-11-26 ENCOUNTER — Other Ambulatory Visit: Payer: Self-pay | Admitting: Internal Medicine

## 2023-11-26 DIAGNOSIS — G4700 Insomnia, unspecified: Secondary | ICD-10-CM

## 2023-11-26 MED ORDER — ALPRAZOLAM ER 0.5 MG PO TB24: 0.5000 mg | ORAL_TABLET | Freq: Every evening | ORAL | 2 refills | Status: DC | PRN

## 2023-11-28 ENCOUNTER — Encounter: Payer: Self-pay | Admitting: Gastroenterology

## 2023-11-28 ENCOUNTER — Ambulatory Visit: Admitting: Gastroenterology

## 2023-11-28 ENCOUNTER — Other Ambulatory Visit: Payer: Self-pay | Admitting: *Deleted

## 2023-11-28 VITALS — BP 107/66 | HR 64 | Temp 98.0°F | Resp 11 | Ht 72.0 in | Wt 188.0 lb

## 2023-11-28 DIAGNOSIS — K3189 Other diseases of stomach and duodenum: Secondary | ICD-10-CM | POA: Diagnosis not present

## 2023-11-28 DIAGNOSIS — Z1211 Encounter for screening for malignant neoplasm of colon: Secondary | ICD-10-CM | POA: Diagnosis not present

## 2023-11-28 DIAGNOSIS — Z860100 Personal history of colon polyps, unspecified: Secondary | ICD-10-CM

## 2023-11-28 DIAGNOSIS — K5730 Diverticulosis of large intestine without perforation or abscess without bleeding: Secondary | ICD-10-CM | POA: Diagnosis not present

## 2023-11-28 DIAGNOSIS — K2970 Gastritis, unspecified, without bleeding: Secondary | ICD-10-CM | POA: Diagnosis not present

## 2023-11-28 DIAGNOSIS — K219 Gastro-esophageal reflux disease without esophagitis: Secondary | ICD-10-CM | POA: Diagnosis not present

## 2023-11-28 DIAGNOSIS — R102 Pelvic and perineal pain: Secondary | ICD-10-CM | POA: Diagnosis not present

## 2023-11-28 DIAGNOSIS — R109 Unspecified abdominal pain: Secondary | ICD-10-CM

## 2023-11-28 DIAGNOSIS — Z800 Family history of malignant neoplasm of digestive organs: Secondary | ICD-10-CM | POA: Diagnosis not present

## 2023-11-28 DIAGNOSIS — K640 First degree hemorrhoids: Secondary | ICD-10-CM

## 2023-11-28 DIAGNOSIS — E7800 Pure hypercholesterolemia, unspecified: Secondary | ICD-10-CM | POA: Diagnosis not present

## 2023-11-28 MED ORDER — SODIUM CHLORIDE 0.9 % IV SOLN
500.0000 mL | Freq: Once | INTRAVENOUS | Status: DC
Start: 2023-11-28 — End: 2023-11-28

## 2023-11-28 MED ORDER — PANTOPRAZOLE SODIUM 40 MG PO TBEC: 40.0000 mg | DELAYED_RELEASE_TABLET | Freq: Every day | ORAL | 6 refills | Status: AC

## 2023-11-28 NOTE — Progress Notes (Signed)
 Called to room to assist during endoscopic procedure.  Patient ID and intended procedure confirmed with present staff. Received instructions for my participation in the procedure from the performing physician.

## 2023-11-28 NOTE — Patient Instructions (Addendum)
 Change Protonix  40 mg to by mouth daily.  Refills sent to preferred pharmacy.  Continue Miralax 17 g by mouth daily.  REPEAT colonoscopy is not recommended for screening purposes.  YOU HAD AN ENDOSCOPIC PROCEDURE TODAY AT THE [facility]:   Refer to the procedure report that was given to you for any specific questions about what was found during the examination.  If the procedure report does not answer your questions, please call your gastroenterologist to clarify.  If you requested that your care partner not be given the details of your procedure findings, then the procedure report has been included in a sealed envelope for you to review at your convenience later.  YOU SHOULD EXPECT: Some feelings of bloating in the abdomen. Passage of more gas than usual.  Walking can help get rid of the air that was put into your GI tract during the procedure and reduce the bloating. If you had a lower endoscopy (such as a colonoscopy or flexible sigmoidoscopy) you may notice spotting of blood in your stool or on the toilet paper. If you underwent a bowel prep for your procedure, you may not have a normal bowel movement for a few days.  Please Note:  You might notice some irritation and congestion in your nose or some drainage.  This is from the oxygen used during your procedure.  There is no need for concern and it should clear up in a day or so.  SYMPTOMS TO REPORT IMMEDIATELY:  Following lower endoscopy (colonoscopy or flexible sigmoidoscopy):  Excessive amounts of blood in the stool  Significant tenderness or worsening of abdominal pains  Swelling of the abdomen that is new, acute  Fever of 100F or higher  Following upper endoscopy (EGD)  Vomiting of blood or coffee ground material  New chest pain or pain under the shoulder blades  Painful or persistently difficult swallowing  New shortness of breath  Fever of 100F or higher  Black, tarry-looking stools  For urgent or emergent  issues, a gastroenterologist can be reached at any hour by calling [phone]. Do not use MyChart messaging for urgent concerns.    DIET:  We do recommend a small meal at first, but then you may proceed to your regular diet.  Drink plenty of fluids but you should avoid alcoholic beverages for 24 hours.  ACTIVITY:  You should plan to take it easy for the rest of today and you should NOT DRIVE or use heavy machinery until tomorrow (because of the sedation medicines used during the test).    FOLLOW UP: Our staff will call the number listed on your records the next business day following your procedure.  We will call around 7:15- 8:00 am to check on you and address any questions or concerns that you may have regarding the information given to you following your procedure. If we do not reach you, we will leave a message.     If any biopsies were taken you will be contacted by phone or by letter within the next 1-3 weeks.  Please call us  at [phone] if you have not heard about the biopsies in 3 weeks.    SIGNATURES/CONFIDENTIALITY: You and/or your care partner have signed paperwork which will be entered into your electronic medical record.  These signatures attest to the fact that that the information above on your After Visit Summary has been reviewed and is understood.  Full responsibility of the confidentiality of this discharge information lies with you and/or your  care-partner.

## 2023-11-28 NOTE — Progress Notes (Signed)
 Chief Complaint: FU  Referring Provider:  Mitcheal Amy, Reymundo Caulk, MD      ASSESSMENT AND PLAN;   #1. Gen abdo pain with bloating. H/O chronic constipation likely OIC.  #2. GERD  #3. FH of colon cancer (brother at age 72), H/O colonic polyps.  Neg colon 08/2017. Rpt in 5 yrs.  Plan: - Miralax 17g po bid until good BM, then QD - If still with problems, CT AP with contrast - Continue protonix  20 BID - Pt will get in touch with Dr Mitcheal Amy regarding ref to ENT. RE: Post nasal drip - FU in 6 months.  Earlier, if still with problems. - Discussed with patient and patient's wife.  HPI:    Gary Larson is a 73 y.o. male  With chronic back pain on narcotics, GERD, OA, anxiety/depression, HLD  With generalized abdominal discomfort with bloating and constipation.  He has taken Ex-Lax with some relief.  He has felt somewhat better.  MiraLAX works the best which he takes on as needed basis only.  He denies having any melena or hematochezia.  Had negative colonoscopy in 2019 as below.  Also has been having post nasal drip with resultant cough  No heartburn as long as he takes Protonix  20 mg p.o. twice daily.  No weight loss or loss of appetite otherwise.   Past GI work-up:  CT AP 12/05/2019 1. Multiple bilateral renal calculi. 2. Moderate left hydronephrosis and hydroureter. 6 mm calculus in the left ladder appears to have passed in the bladder, versus in the distal left UVJ. Bladder wall thickening.  EGD 08/04/2018 -Mildly irregular Z line. Biopsied. -Mild gastritis. -Some retained food in the stomach without outlet obstruction.  Colon 09/17/2017 after 2 day prep -Mild pancolonic diverticulosis -Otherwise normal colonoscopy -Repeat in 5 years due to family history  -Elevated LFTs (resolved). S/P Lap chole d/t acalculous cholecystitis 03/15/2018, neg MRCP 06/02/2018 for CBD stones (did show increased CBD dilatation from 11 to 13 mm, mild IHBR dilatation, normal PD), neg acute panel  03/2015.   Past Medical History:  Diagnosis Date   Aortic valve stenosis 06/08/2023   Arthritis    BMI 26.0-26.9,adult 06/08/2023   Chronic pain syndrome    Chronically on benzodiazepine therapy 06/01/2016   Common bile duct dilatation 03/14/2018   Coronary artery calcification seen on CAT scan 06/07/2022   Degenerative disc disease, lumbar    Gastritis    Gastroesophageal reflux disease with esophagitis without hemorrhage 06/07/2022   GERD (gastroesophageal reflux disease)    Heart murmur 06/07/2022   History of carpal tunnel release    Hypercalcemia 06/28/2022   Hypercholesterolemia    Irritable bowel syndrome (IBS) 06/01/2016   Kidney stones    Left shoulder pain 05/30/2016   Lumbar back pain 08/02/2022   Opioid use agreement exists 06/01/2016   Primary osteoarthritis involving multiple joints 06/08/2023   Seasonal allergies 06/08/2023   Spondylolisthesis 08/24/2018   Syncope 06/08/2023   Syncope and collapse 03/26/2023   Tear of left rotator cuff 11/05/2020   Added automatically from request for surgery [id number]     Transaminitis 03/14/2018    Past Surgical History:  Procedure Laterality Date   CARPAL TUNNEL RELEASE Right    CHOLECYSTECTOMY  03/14/2018   COLONOSCOPY WITH ESOPHAGOGASTRODUODENOSCOPY (EGD)  2019   SHOULDER SURGERY Bilateral    5 times resected    Family History  Problem Relation Age of Onset   Breast cancer Sister    Colon cancer Sister    Colon cancer  Brother    Diabetes Brother    Esophageal cancer Brother    Diabetes Brother    Stomach cancer Brother    Colon polyps Neg Hx    Rectal cancer Neg Hx     Social History   Tobacco Use   Smoking status: Never    Passive exposure: Never   Smokeless tobacco: Never  Vaping Use   Vaping status: Never Used  Substance Use Topics   Alcohol  use: Not Currently   Drug use: Never    Current Outpatient Medications  Medication Sig Dispense Refill   ALPRAZolam  (XANAX  XR) 0.5 MG 24 hr tablet Take 1  tablet (0.5 mg total) by mouth at bedtime as needed for anxiety. 30 tablet 2   aspirin EC 81 MG tablet Take 81 mg by mouth daily. Swallow whole.     atorvastatin  (LIPITOR) 80 MG tablet Take 1 tablet (80 mg total) by mouth at bedtime. 90 tablet 1   diclofenac Sodium (VOLTAREN) 1 % GEL Apply topically 4 (four) times daily.     diphenhydrAMINE (BENADRYL) 25 mg capsule Take 25 mg by mouth at bedtime.     ibuprofen (ADVIL) 200 MG tablet Take 200 mg by mouth at bedtime.     Current Facility-Administered Medications  Medication Dose Route Frequency Provider Last Rate Last Admin   0.9 %  sodium chloride  infusion  500 mL Intravenous Once Lajuan Pila, MD        Allergies  Allergen Reactions   Morphine Itching    Review of Systems:  Psychiatric/Behavioral: Has anxiety or depression     Physical Exam:    BP 138/75 (BP Location: Right Arm, Patient Position: Sitting, Cuff Size: Normal)   Pulse 78   Temp 98 F (36.7 C) (Temporal)   Ht 6' (1.829 m)   Wt 188 lb (85.3 kg)   SpO2 99%   BMI 25.50 kg/m  Filed Weights   11/28/23 0847  Weight: 188 lb (85.3 kg)   Constitutional:  Well-developed, in no acute distress. Psychiatric: Normal mood and affect. Behavior is normal. HEENT: Pupils normal.  Conjunctivae are normal. No scleral icterus. Neck supple.  Cardiovascular: Normal rate, regular rhythm. No edema Pulmonary/chest: Effort normal and breath sounds normal. No wheezing, rales or rhonchi. Abdominal: Soft, nondistended. Nontender. Bowel sounds active throughout. There are no masses palpable. No hepatomegaly. Rectal:  defered Neurological: Alert and oriented to person place and time. Skin: Skin is warm and dry. No rashes noted.    Magnus Schuller, MD 11/28/2023, 9:30 AM  Cc: Wayne Haines, MD

## 2023-11-28 NOTE — Telephone Encounter (Signed)
 Opened in error

## 2023-11-28 NOTE — Op Note (Signed)
 [facility] Patient Name: Gary Larson Procedure Date: 11/28/2023 9:32 AM MRN: [id number] Endoscopist: Lajuan Pila , MD, [id number] Age: 73 Referring MD:  Date of Birth: [birth year] Gender: Male Account #: [id number] Procedure:                Upper GI endoscopy Indications:              Epigastric abdominal pain Medicines:                Monitored Anesthesia Care Procedure:                Pre-Anesthesia Assessment:                           - Prior to the procedure, a History and Physical                            was performed, and patient medications and                            allergies were reviewed. The patient's tolerance of                            previous anesthesia was also reviewed. The risks                            and benefits of the procedure and the sedation                            options and risks were discussed with the patient.                            All questions were answered, and informed consent                            was obtained. Prior Anticoagulants: The patient has                            taken no anticoagulant or antiplatelet agents. ASA                            Grade Assessment: II - A patient with mild systemic                            disease. After reviewing the risks and benefits,                            the patient was deemed in satisfactory condition to                            undergo the procedure.                           After obtaining informed consent, the endoscope was  passed under direct vision. Throughout the                            procedure, the patient's blood pressure, pulse, and                            oxygen saturations were monitored continuously. The                            GIF [id number] #[id number] was introduced through the                            mouth, and advanced to the second part of duodenum.                            The upper GI endoscopy was  accomplished without                            difficulty. The patient tolerated the procedure                            well. Scope In: Scope Out: Findings:                 The examined esophagus was normal.                           The Z-line was regular and was found 40 cm from the                            incisors.                           Localized mild inflammation characterized by                            erythema was found in the gastric antrum. Biopsies                            were taken with a cold forceps for histology.                           Localized mildly erythematous mucosa without active                            bleeding and with no stigmata of bleeding was found                            in the duodenal bulb.                           The exam was otherwise without abnormality. Complications:            No immediate complications. Estimated Blood Loss:     Estimated blood loss: none. Impression:               -  Mild gastroduodenitis. Recommendation:           - Patient has a contact number available for                            emergencies. The signs and symptoms of potential                            delayed complications were discussed with the                            patient. Return to normal activities tomorrow.                            Written discharge instructions were provided to the                            patient.                           - Resume previous diet.                           - Continue present medications. Change protonix                             40mg  po QD #90, 6RF                           - Await pathology results.                           - The findings and recommendations were discussed                            with the patient's family. Lajuan Pila, MD 11/28/2023 9:43:55 AM This report has been signed electronically.

## 2023-11-28 NOTE — Progress Notes (Signed)
 Vitals-DT  Pt's states no medical or surgical changes since previsit or office visit.

## 2023-11-28 NOTE — Op Note (Signed)
 [facility] Patient Name: Gary Larson Procedure Date: 11/28/2023 9:32 AM MRN: [id number] Endoscopist: Lajuan Pila , MD, [id number] Age: 73 Referring MD:  Date of Birth: 01/08/1951 Gender: Male Account #: [id number] Procedure:                Colonoscopy Indications:              High risk colon cancer surveillance: Personal                            history of colonic polyps. FH of colon cancer                            (brother at age 109). Medicines:                Monitored Anesthesia Care Procedure:                Pre-Anesthesia Assessment:                           - Prior to the procedure, a History and Physical                            was performed, and patient medications and                            allergies were reviewed. The patient's tolerance of                            previous anesthesia was also reviewed. The risks                            and benefits of the procedure and the sedation                            options and risks were discussed with the patient.                            All questions were answered, and informed consent                            was obtained. Prior Anticoagulants: The patient has                            taken no anticoagulant or antiplatelet agents. ASA                            Grade Assessment: III - A patient with severe                            systemic disease. After reviewing the risks and                            benefits, the patient was deemed in satisfactory  condition to undergo the procedure.                           After obtaining informed consent, the colonoscope                            was passed under direct vision. Throughout the                            procedure, the patient's blood pressure, pulse, and                            oxygen saturations were monitored continuously. The                            Olympus Scope [id number] was introduced through  the                            anus and advanced to the the cecum, identified by                            appendiceal orifice and ileocecal valve. The                            colonoscopy was performed without difficulty. The                            patient tolerated the procedure well. The quality                            of the bowel preparation was adequate to identify                            polyps. The ileocecal valve, appendiceal orifice,                            and rectum were photographed. Scope In: 9:46:52 AM Scope Out: 10:04:32 AM Scope Withdrawal Time: 0 hours 12 minutes 11 seconds  Total Procedure Duration: 0 hours 17 minutes 40 seconds  Findings:                 Many small-mouthed diverticula were found in the                            sigmoid colon, few in descending colon and                            ascending colon.                           Non-bleeding internal hemorrhoids were found during                            retroflexion. The hemorrhoids were small and Grade  I (internal hemorrhoids that do not prolapse).                           The exam was otherwise without abnormality on                            direct and retroflexion views. Complications:            No immediate complications. Estimated Blood Loss:     Estimated blood loss: none. Impression:               - Mild pancolonic diverticulosis.                           - Non-bleeding internal hemorrhoids.                           - The examination was otherwise normal on direct                            and retroflexion views.                           - No specimens collected. Recommendation:           - Patient has a contact number available for                            emergencies. The signs and symptoms of potential                            delayed complications were discussed with the                            patient. Return to normal activities  tomorrow.                            Written discharge instructions were provided to the                            patient.                           - Resume previous diet.                           - Continue present medications including MiraLAX 17                            g p.o. daily.                           - Repeat colonoscopy is not recommended for                            screening purposes. Hence, repeat colonoscopy only  if with any new problems or change in family                            history.                           - The findings and recommendations were discussed                            with the patient's family. Lajuan Pila, MD 11/28/2023 10:09:35 AM This report has been signed electronically.

## 2023-11-28 NOTE — Progress Notes (Signed)
 Pt sedate, gd SR's, VSS, report to RN

## 2023-11-29 ENCOUNTER — Telehealth: Payer: Self-pay

## 2023-11-29 IMAGING — CT CT ABD-PELV W/ CM
2 of 5 series · 16 of 46 positions shown, 18 images · IV contrast (OMNIPAQUE)
Comparison: 12/05/2019

CLINICAL DATA: Nausea, heartburn, epigastric abdominal pain

EXAM:
CT ABDOMEN AND PELVIS WITH CONTRAST
TECHNIQUE: Multidetector CT imaging of the abdomen and pelvis was performed
using the standard protocol following bolus administration of
intravenous contrast.
CONTRAST:  80mL OMNIPAQUE IOHEXOL 350 MG/ML SOLN, additional oral
enteric contrast

[Series 2: axial st · axial · 0.88mm/px · z∈[+1202,+1627]mm · 13 of 99 slices shown, 15 images]
[im 7/99  soft-tissue]
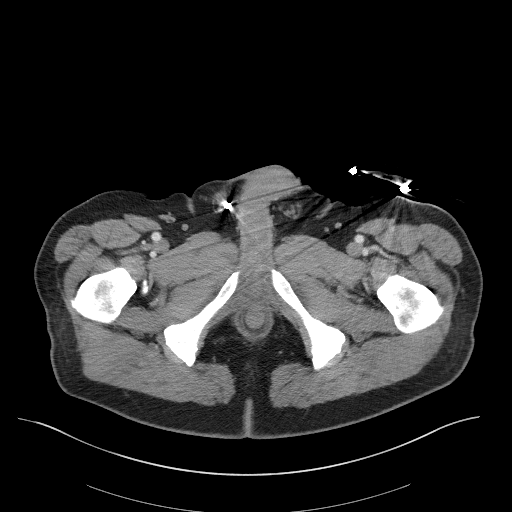
[im 7/99  bone]
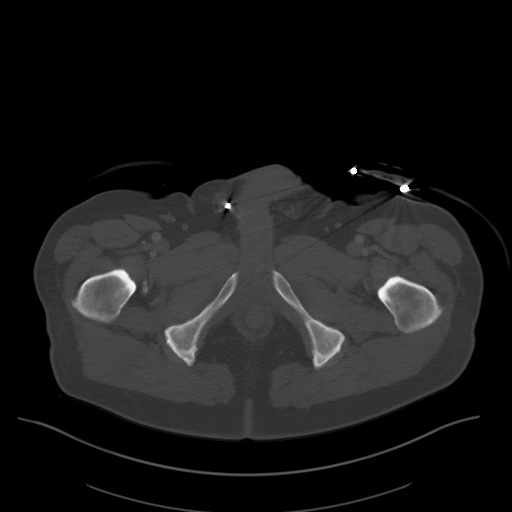
[im 13/99  soft-tissue]
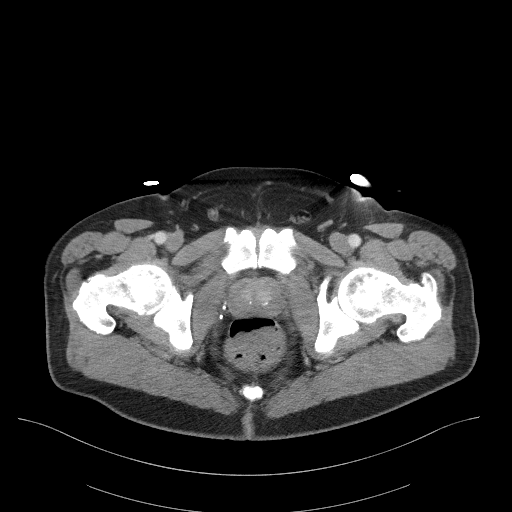
[im 19/99  soft-tissue]
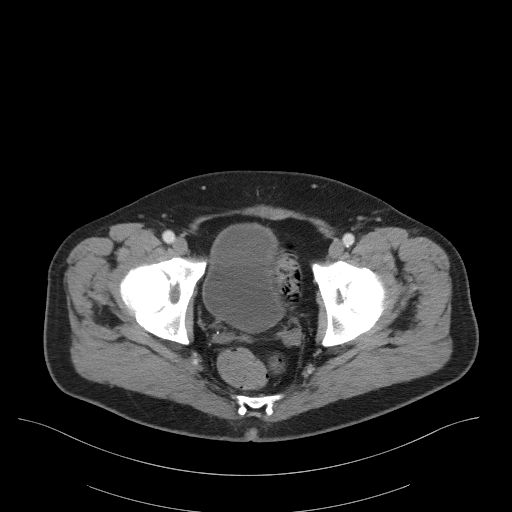
[im 31/99  soft-tissue]
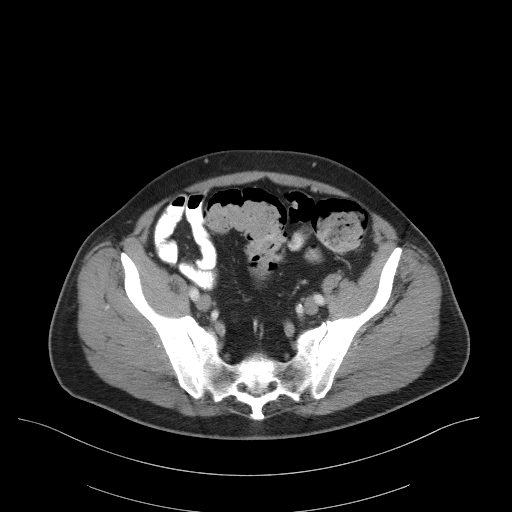
[im 37/99  soft-tissue]
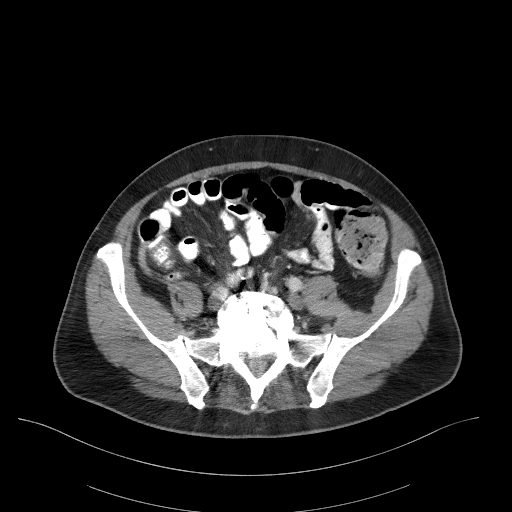
[im 43/99  soft-tissue]
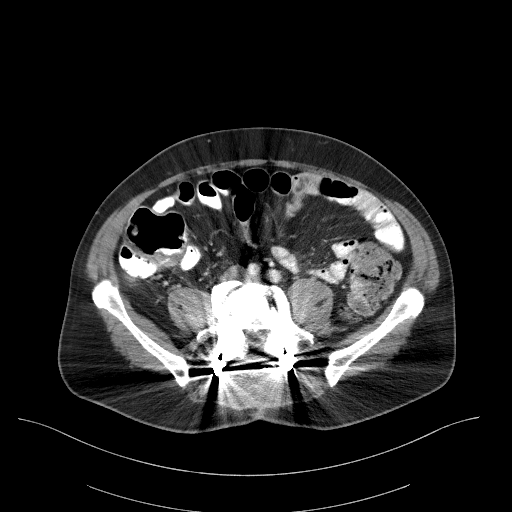
[im 50/99  soft-tissue]
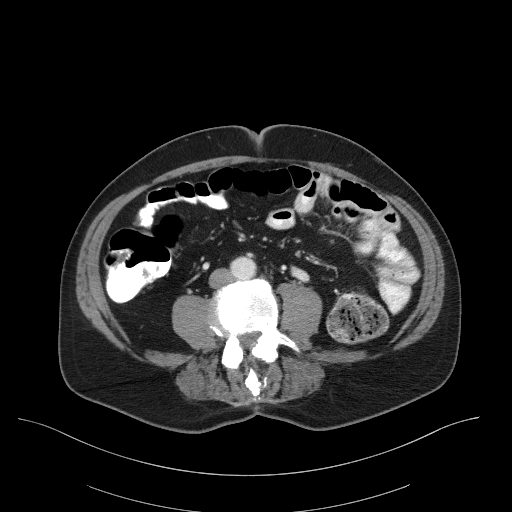
[im 56/99  soft-tissue]
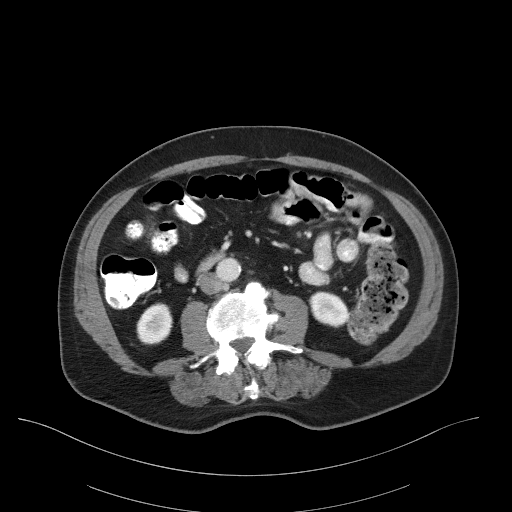
[im 62/99  soft-tissue]
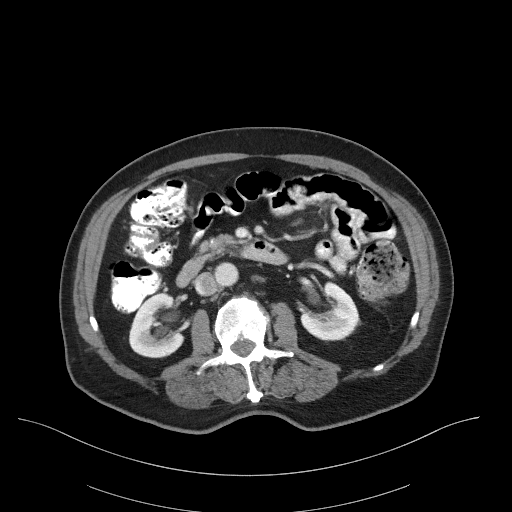
[im 62/99  bone]
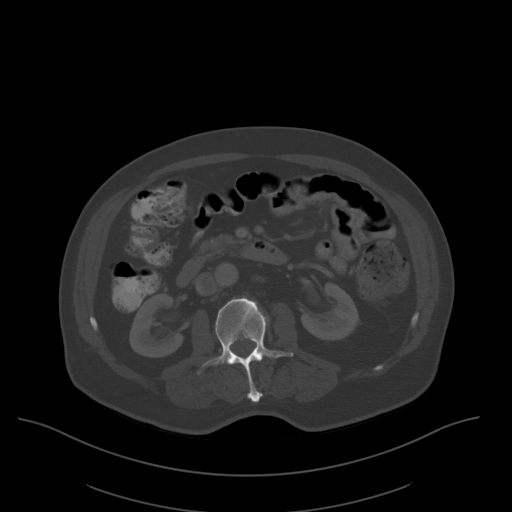
[im 68/99  soft-tissue]
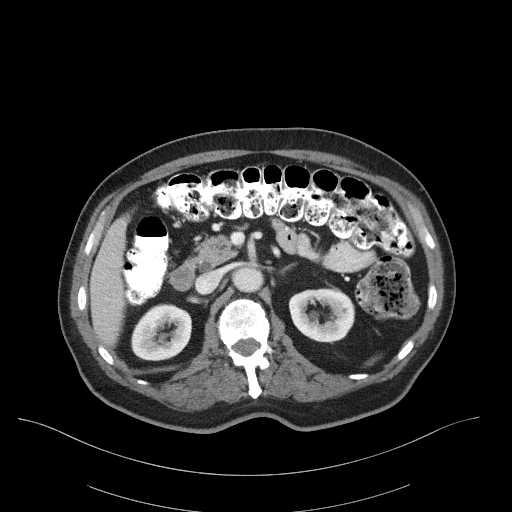
[im 80/99  soft-tissue]
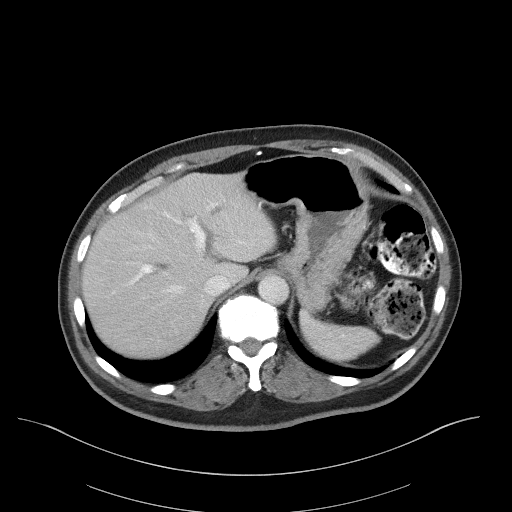
[im 86/99  soft-tissue]
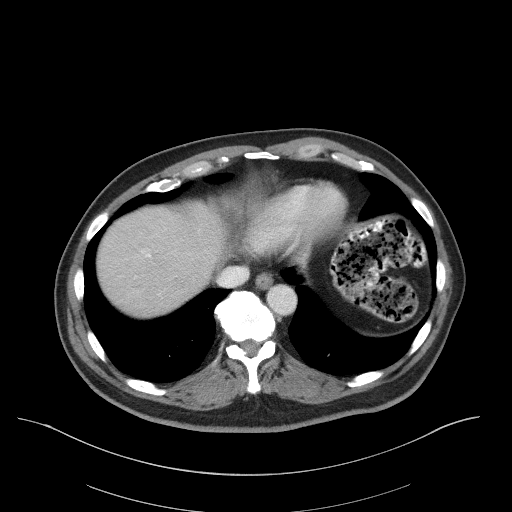
[im 92/99  soft-tissue]
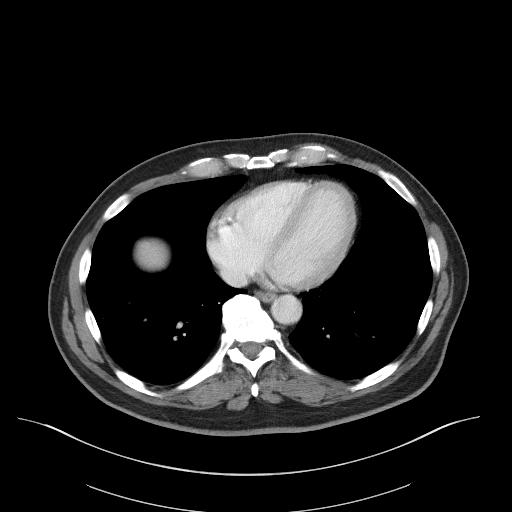

[Series 5: coronal st · coronal · 0.71mm/px · 3 of 95 slices shown]
[im 32/95  soft-tissue]
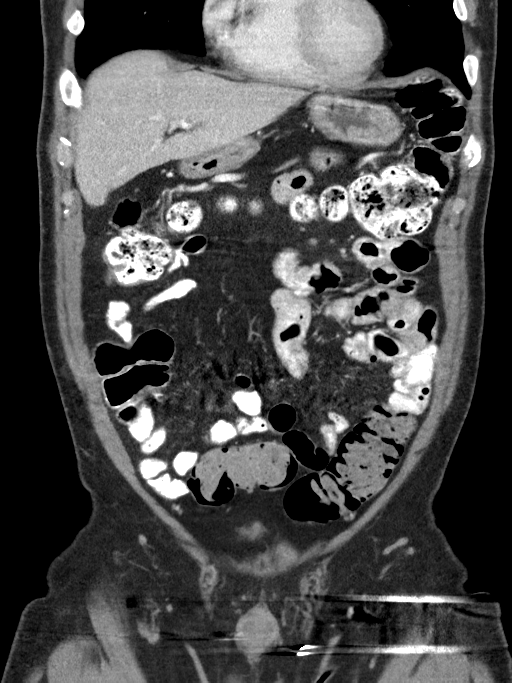
[im 42/95  soft-tissue]
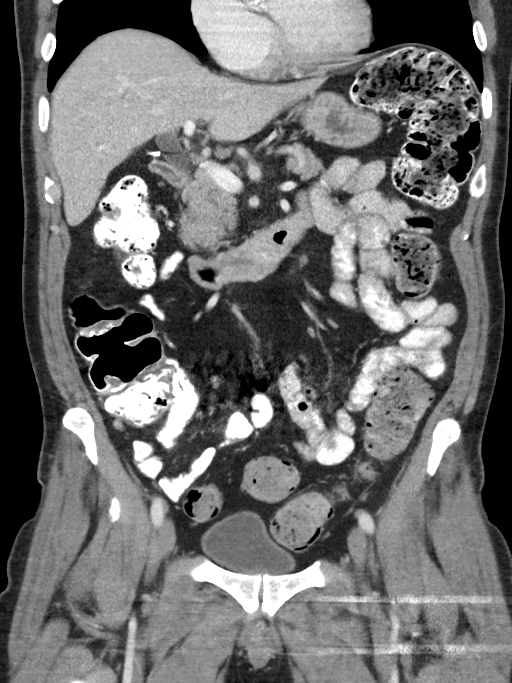
[im 53/95  soft-tissue]
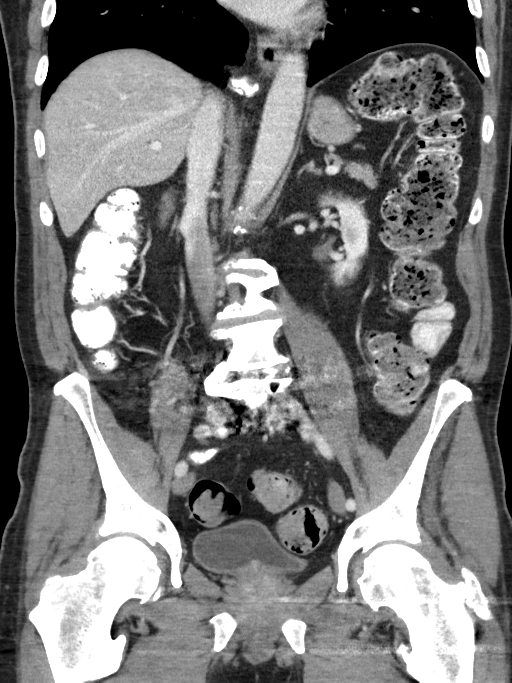

[16 of 46 positions shown; findings below may reference images not displayed]

FINDINGS: Lower chest: No acute abnormality. Dense aortic valve
calcifications. Three-vessel coronary artery calcifications.

Hepatobiliary: No focal liver abnormality is seen. Status post
cholecystectomy. Mild postoperative biliary dilatation.

Pancreas: Unremarkable. No pancreatic ductal dilatation or
surrounding inflammatory changes.

Spleen: Normal in size without significant abnormality.

Adrenals/Urinary Tract: Adrenal glands are unremarkable. Small
bilateral nonobstructive renal calculi. No ureteral calculi or
hydronephrosis. Bladder is unremarkable.

Stomach/Bowel: Stomach is within normal limits. Appendix appears
normal. No evidence of bowel wall thickening, distention, or
inflammatory changes. Large burden of stool in the distal colon and
rectum.

Vascular/Lymphatic: Aortic atherosclerosis. No enlarged abdominal or
pelvic lymph nodes.

Reproductive: No mass or other significant abnormality.

Other: No abdominal wall hernia or abnormality. No abdominopelvic
ascites.

Musculoskeletal: No acute or significant osseous findings.
IMPRESSION: 1. No acute CT findings of the abdomen or pelvis to explain nausea,
heartburn, or epigastric abdominal pain.
2. Large burden of stool in the distal colon and rectum.
3. Bilateral nonobstructive nephrolithiasis.  No hydronephrosis.
4. Status post cholecystectomy.
5. Coronary artery disease.
6. Dense aortic valve calcifications. Correlate for
echocardiographic evidence of aortic valve dysfunction.

Aortic Atherosclerosis ([id number]).

## 2023-11-29 NOTE — Telephone Encounter (Signed)
 No answer after follow up call. Voice message left.

## 2023-11-30 LAB — SURGICAL PATHOLOGY

## 2023-12-02 ENCOUNTER — Encounter: Payer: Self-pay | Admitting: Gastroenterology

## 2023-12-12 ENCOUNTER — Other Ambulatory Visit: Payer: Self-pay | Admitting: Internal Medicine

## 2024-01-08 ENCOUNTER — Ambulatory Visit: Admitting: Internal Medicine

## 2024-01-08 ENCOUNTER — Encounter: Payer: Self-pay | Admitting: Internal Medicine

## 2024-01-08 VITALS — BP 122/70 | HR 88 | Temp 98.3°F | Resp 18 | Ht 72.0 in | Wt 192.4 lb

## 2024-01-08 DIAGNOSIS — E7800 Pure hypercholesterolemia, unspecified: Secondary | ICD-10-CM

## 2024-01-08 DIAGNOSIS — I350 Nonrheumatic aortic (valve) stenosis: Secondary | ICD-10-CM

## 2024-01-08 DIAGNOSIS — M25552 Pain in left hip: Secondary | ICD-10-CM | POA: Insufficient documentation

## 2024-01-08 HISTORY — DX: Pain in left hip: M25.552

## 2024-01-08 MED ORDER — METHYLPREDNISOLONE 4 MG PO TBPK: ORAL_TABLET | ORAL | 0 refills | Status: DC

## 2024-01-08 NOTE — Assessment & Plan Note (Signed)
 We will obtain a xray of his left hip.  We will give him a medrol  dose pak.  If he is not improved over the next 3-4 weeks, he is to call or RTC.

## 2024-01-08 NOTE — Assessment & Plan Note (Signed)
 The cardiologist just saw him for his AV stenosis and plans see him back in about 4 months.  He is currently stable.

## 2024-01-08 NOTE — Assessment & Plan Note (Signed)
 He has both aortic atherosclerosis and coronary calcifications seen.  His goal LDL should probably be <75.  We will check his lipid today.

## 2024-01-08 NOTE — Progress Notes (Signed)
 Office Visit  Subjective   Patient ID: Gary Larson   DOB: [birth year]   Age: 73 y.o.   MRN: [id number]   Chief Complaint Chief Complaint  Patient presents with   Follow-up    23M F/U     History of Present Illness He did see Dr. Ardith Krill in 09/2023 for followup of his history of syncope and aortic stenosis.  I did see Gary Larson originally in 03/2023 for sycope. He had an episode of dizziness and passing out.  This started a few years ago but this was rare but this started occurring a few times each week about 3-4 months prior.  This usually occurs when he goes from a sitting to standing position.  He has had presyncopal episodes of this in the past but had a full syncopal episode on 02/27/2023 when he was looking up and following a jet.  He passed out when he did this and struck his head and flat of his back.  He had some bruising with this but no long standing headache, depression, mood changes or other problems.  There is no associated chest pain, SOB, palpitations or other problems.  Again, this dizziness only occurs when he goes from a sitting to standing position.  He had a recent preoperative workup where he had an ECHO on 09/22/2022 which showed a normal LV function with a LVEF of 60-65% without wall motion abnormalities but he did have Grade I diastolic dysfunction.  His RV systolic function was normal.  He had an aortic valve that had moderate calcification with moderate thickening with mild AV regurgiation and moderate aortic valve stenosis with AV area of 1.25 cm.  They noted some coronary calcifications on a prior CT scan but had no symptoms of angina at that time.  I had sent him to see Dr. Sandee Crook whom he saw in 09/2021.  He did not think a coronary calcium  score would be helpful but recommended to continue on his statin as this finding on his CT scan was a marker for increased cardiovascular risk.  I did an EKG on him in 03/2023 and that showed  NSR without any ST/T wave changes.  I also did  orthostatic on him and his BP and HR remained normal.  I sent him to see Dr. Mikell Aldo who did a Zio patch on him on 03/27/2023 Predominant underlying rhythm was Sinus Rhythm.  He went back to see cardiology where he saw them in 09/2023.  They wanted to make sure his aortic valve was not causing syncope and they want him to return in 6 months for a possible repeat ECHO.  His last ECHO was done on 09/19/2023 where his LVEF was 60-65% with normal LF vunction.  His left ventricle has no regional wall motion abnormalities. Left ventricular diastolic parameters are indeterminate. Right ventricular systolic function was normal. The right ventricular size is normal. There is normal pulmonary artery systolic pressure.   Mild mitral valve regurgitation. No evidence of mitral stenosis. The aortic valve is tricuspid. There is mild calcification of the aortic valve. There is moderate thickening of the aortic valve. Aortic valve regurgitation is mild.  Moderate aortic valve stenosis. Aortic valve area, by VTI measures 1.69 cm. Aortic valve mean gradient measures 18.6 mmHg. Aortic valve Vmax measures 3.08 m/s. There is mild dilatation of the ascending aorta, measuring 38 mm.  They also did a carotid US  on 10/18/2023 which showed no evidence of stenosis in the right ICA. No no evidence of stenosis  in the left ICA. The Bilateral vertebral arteries demonstrated antegrade flow.  Normal flow hemodynamics were seen in bilateral subclavian arteries.   Gary Larson returns today for routine followup on his cholesterol. He has other risk factors including aortic atherosclerosis seen on CT scan of abd/pelvis in 03/2021 as well as a history of coronary calcifications CT scan as well.  On his last visit, I did switch his simvastatin to atorvastatin  as his cholesterol was not controlled.  Overall, he states he is doing well and is without any complaints or problems at this time. He specifically denies chest pain, abdominal pain, nausea,  diarrhea, and myalgias. He remains on dietary management as well as the following cholesterol lowering medications atorvastatin  40 mg oral tablet qhs. He is fasting in anticipation for labs today.   He is also having problems with left hip pain that began 3-4 months ago. His pain is located in his lateral left hip where he can have an intermittent sharp pain.  The patient is currently was using motrin once a day but he is not taking anything now.  He states he is having some limp.  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  There is no weakness in his legs.        Past Medical History Past Medical History:  Diagnosis Date   Aortic valve stenosis 06/08/2023   Arthritis    BMI 26.0-26.9,adult 06/08/2023   Chronic pain syndrome    Chronically on benzodiazepine therapy 06/01/2016   Common bile duct dilatation 03/14/2018   Coronary artery calcification seen on CAT scan 06/07/2022   Degenerative disc disease, lumbar    Gastritis    Gastroesophageal reflux disease with esophagitis without hemorrhage 06/07/2022   GERD (gastroesophageal reflux disease)    Heart murmur 06/07/2022   History of carpal tunnel release    Hypercalcemia 06/28/2022   Hypercholesterolemia    Irritable bowel syndrome (IBS) 06/01/2016   Kidney stones    Left shoulder pain 05/30/2016   Lumbar back pain 08/02/2022   Opioid use agreement exists 06/01/2016   Primary osteoarthritis involving multiple joints 06/08/2023   Seasonal allergies 06/08/2023   Spondylolisthesis 08/24/2018   Syncope 06/08/2023   Syncope and collapse 03/26/2023   Tear of left rotator cuff 11/05/2020   Added automatically from request for surgery [id number]     Transaminitis 03/14/2018     Allergies Allergies  Allergen Reactions   Morphine Itching     Medications  Current Outpatient Medications:    ALPRAZolam  (XANAX  XR) 0.5 MG 24 hr tablet, Take 1 tablet (0.5 mg total) by mouth at bedtime as needed for anxiety., Disp:  30 tablet, Rfl: 2   aspirin EC 81 MG tablet, Take 81 mg by mouth daily. Swallow whole., Disp: , Rfl:    atorvastatin  (LIPITOR) 80 MG tablet, TAKE 1 TABLET(80 MG) BY MOUTH AT BEDTIME, Disp: 90 tablet, Rfl: 1   diclofenac Sodium (VOLTAREN) 1 % GEL, Apply topically 4 (four) times daily., Disp: , Rfl:    diphenhydrAMINE (BENADRYL) 25 mg capsule, Take 25 mg by mouth at bedtime., Disp: , Rfl:    ibuprofen (ADVIL) 200 MG tablet, Take 200 mg by mouth at bedtime., Disp: , Rfl:    pantoprazole  (PROTONIX ) 40 MG tablet, Take 1 tablet (40 mg total) by mouth daily., Disp: 90 tablet, Rfl: 6   Review of Systems Review of Systems  Constitutional:  Negative for chills, fever and malaise/fatigue.  Eyes:  Negative for blurred vision and  double vision.  Respiratory:  Negative for cough and shortness of breath.   Cardiovascular:  Negative for chest pain, palpitations, orthopnea and leg swelling.  Gastrointestinal:  Negative for abdominal pain, constipation, diarrhea, nausea and vomiting.  Skin:  Negative for itching and rash.  Neurological:  Negative for dizziness, weakness and headaches.       Objective:    Vitals BP 122/70   Pulse 88   Temp 98.3 F (36.8 C) (Temporal)   Resp 18   Ht 6' (1.829 m)   Wt 192 lb 6.4 oz (87.3 kg)   SpO2 97%   BMI 26.09 kg/m    Physical Examination Physical Exam Constitutional:      Appearance: Normal appearance. He is not ill-appearing.  Cardiovascular:     Rate and Rhythm: Normal rate and regular rhythm.     Pulses: Normal pulses.     Heart sounds: No murmur heard.    No friction rub. No gallop.  Pulmonary:     Effort: Pulmonary effort is normal. No respiratory distress.     Breath sounds: No wheezing, rhonchi or rales.  Abdominal:     General: Abdomen is flat. Bowel sounds are normal. There is no distension.     Palpations: Abdomen is soft.     Tenderness: There is no abdominal tenderness.  Musculoskeletal:     Right lower leg: No edema.     Left lower  leg: No edema.  Skin:    General: Skin is warm and dry.     Findings: No rash.  Neurological:     General: No focal deficit present.     Mental Status: He is alert and oriented to person, place, and time.        Assessment & Plan:   Aortic valve stenosis The cardiologist just saw him for his AV stenosis and plans see him back in about 4 months.  He is currently stable.  Hypercholesterolemia He has both aortic atherosclerosis and coronary calcifications seen.  His goal LDL should probably be <75.  We will check his lipid today.  Left hip pain We will obtain a xray of his left hip.  We will give him a medrol  dose pak.  If he is not improved over the next 3-4 weeks, he is to call or RTC.    No follow-ups on file.   Wayne Haines, MD

## 2024-01-09 DIAGNOSIS — M1612 Unilateral primary osteoarthritis, left hip: Secondary | ICD-10-CM | POA: Diagnosis not present

## 2024-01-09 LAB — LIPID PANEL
Chol/HDL Ratio: 2.9 ratio (ref 0.0–5.0)
Cholesterol, Total: 148 mg/dL (ref 100–199)
HDL: 51 mg/dL (ref >39)
LDL Chol Calc (NIH): 82 mg/dL (ref 0–99)
Triglycerides: 80 mg/dL (ref 0–149)
VLDL Cholesterol Cal: 15 mg/dL (ref 5–40)

## 2024-01-09 LAB — CMP14 + ANION GAP
ALT: 37 IU/L (ref 0–44)
AST: 23 IU/L (ref 0–40)
Albumin: 4.5 g/dL (ref 3.8–4.8)
Alkaline Phosphatase: 121 IU/L (ref 44–121)
Anion Gap: 17 mmol/L (ref 10.0–18.0)
BUN/Creatinine Ratio: 17 (ref 10–24)
BUN: 19 mg/dL (ref 8–27)
Bilirubin Total: 0.2 mg/dL (ref 0.0–1.2)
CO2: 20 mmol/L (ref 20–29)
Calcium: 9.7 mg/dL (ref 8.6–10.2)
Chloride: 103 mmol/L (ref 96–106)
Creatinine, Ser: 1.11 mg/dL (ref 0.76–1.27)
Globulin, Total: 2.9 g/dL (ref 1.5–4.5)
Glucose: 96 mg/dL (ref 70–99)
Potassium: 5.4 mmol/L — ABNORMAL HIGH (ref 3.5–5.2)
Sodium: 140 mmol/L (ref 134–144)
Total Protein: 7.4 g/dL (ref 6.0–8.5)
eGFR: 71 mL/min/{1.73_m2} (ref >59)

## 2024-01-15 ENCOUNTER — Ambulatory Visit: Payer: Self-pay

## 2024-01-15 NOTE — Progress Notes (Signed)
 Patient called.  Left message for patient to call back.  I have called and left the patients wife a message to let the patient know to return our phone call. The patient needs to be informed His labs look good.   Aaron Aas

## 2024-01-15 NOTE — Progress Notes (Signed)
 Patient called.  Patient aware.  The patient returned our phone call and I have informed him "His labs look good.".  Pt aware.

## 2024-01-16 ENCOUNTER — Encounter: Payer: Self-pay | Admitting: Internal Medicine

## 2024-02-22 ENCOUNTER — Other Ambulatory Visit: Payer: Self-pay | Admitting: Internal Medicine

## 2024-02-22 DIAGNOSIS — G4700 Insomnia, unspecified: Secondary | ICD-10-CM

## 2024-02-25 ENCOUNTER — Other Ambulatory Visit: Payer: Self-pay | Admitting: Internal Medicine

## 2024-02-25 DIAGNOSIS — G4700 Insomnia, unspecified: Secondary | ICD-10-CM

## 2024-02-25 MED ORDER — ALPRAZOLAM ER 0.5 MG PO TB24: 0.5000 mg | ORAL_TABLET | Freq: Every evening | ORAL | 2 refills | Status: DC | PRN

## 2024-02-27 DIAGNOSIS — M5134 Other intervertebral disc degeneration, thoracic region: Secondary | ICD-10-CM | POA: Diagnosis not present

## 2024-02-27 DIAGNOSIS — M25552 Pain in left hip: Secondary | ICD-10-CM | POA: Diagnosis not present

## 2024-03-04 ENCOUNTER — Ambulatory Visit: Payer: Self-pay

## 2024-03-12 DIAGNOSIS — M1612 Unilateral primary osteoarthritis, left hip: Secondary | ICD-10-CM | POA: Diagnosis not present

## 2024-03-13 ENCOUNTER — Telehealth: Payer: Self-pay

## 2024-03-13 DIAGNOSIS — Z961 Presence of intraocular lens: Secondary | ICD-10-CM | POA: Diagnosis not present

## 2024-03-13 DIAGNOSIS — H524 Presbyopia: Secondary | ICD-10-CM | POA: Diagnosis not present

## 2024-03-13 NOTE — Telephone Encounter (Signed)
 Received the following message from Dr. Liborio:  No significant abnormalities on the ultrasound exam of your neck vessels. Will review echocardiogram [ultrasound of the heart] once completed on September 23 and will discuss at your subsequent follow-up visit. Please do not hesitate to contact my office with any questions.  Thank you  Called the patient and informed him of the results of his carotid ultrasound. Patient verbalized understanding and had no further questions at this time.

## 2024-04-16 DIAGNOSIS — M1612 Unilateral primary osteoarthritis, left hip: Secondary | ICD-10-CM | POA: Diagnosis not present

## 2024-04-21 ENCOUNTER — Other Ambulatory Visit

## 2024-04-22 ENCOUNTER — Ambulatory Visit

## 2024-04-22 DIAGNOSIS — R55 Syncope and collapse: Secondary | ICD-10-CM | POA: Diagnosis not present

## 2024-04-22 LAB — ECHOCARDIOGRAM COMPLETE
AR max vel: 1.94 cm2
AV Area VTI: 1.91 cm2
AV Area mean vel: 1.81 cm2
AV Mean grad: 21.6 mmHg
AV Peak grad: 38.5 mmHg
Ao pk vel: 3.1 m/s
Area-P 1/2: 3.22 cm2
P 1/2 time: 652 ms
S' Lateral: 2.2 cm

## 2024-04-29 ENCOUNTER — Ambulatory Visit

## 2024-04-29 VITALS — BP 110/58 | HR 70 | Ht 72.0 in | Wt 195.0 lb

## 2024-04-29 DIAGNOSIS — I2510 Atherosclerotic heart disease of native coronary artery without angina pectoris: Secondary | ICD-10-CM | POA: Insufficient documentation

## 2024-04-29 DIAGNOSIS — I350 Nonrheumatic aortic (valve) stenosis: Secondary | ICD-10-CM | POA: Diagnosis not present

## 2024-04-29 DIAGNOSIS — E7800 Pure hypercholesterolemia, unspecified: Secondary | ICD-10-CM | POA: Diagnosis not present

## 2024-04-29 DIAGNOSIS — R072 Precordial pain: Secondary | ICD-10-CM | POA: Diagnosis not present

## 2024-04-29 DIAGNOSIS — R0609 Other forms of dyspnea: Secondary | ICD-10-CM | POA: Insufficient documentation

## 2024-04-29 DIAGNOSIS — R931 Abnormal findings on diagnostic imaging of heart and coronary circulation: Secondary | ICD-10-CM

## 2024-04-29 MED ORDER — METOPROLOL TARTRATE 100 MG PO TABS
100.0000 mg | ORAL_TABLET | Freq: Once | ORAL | 0 refills | Status: DC
Start: 2024-04-29 — End: 2024-06-23

## 2024-04-29 NOTE — Patient Instructions (Signed)
 Medication Instructions:  Your physician recommends that you continue on your current medications as directed. Please refer to the Current Medication list given to you today.  *If you need a refill on your cardiac medications before your next appointment, please call your pharmacy*  Lab Work: Your physician recommends that you return for lab work in:   Labs today: BMP  If you have labs (blood work) drawn today and your tests are completely normal, you will receive your results only by: MyChart Message (if you have MyChart) OR A paper copy in the mail If you have any lab test that is abnormal or we need to change your treatment, we will call you to review the results.  Testing/Procedures:   Your cardiac CT will be scheduled at one of the below locations:   [facility] [address] [place], [place] [address] [phone] (Severe contrast allergies only)  OR   [facility] [address] [place], [place] [address] [phone]  OR   [facility] [address] [place], [place] [address] [phone]  OR   [facility] [address]  [place], [place] [address] [phone]  OR   [facility] [address] [place], [place] [phone]  If scheduled at [facility], please arrive at the [facility] and Children's Entrance (Entrance C2) of [facility] 30 minutes prior to test start time. You can use the FREE valet parking offered at entrance C (encouraged to control the heart rate for the test)  Proceed to the [facility] Radiology Department (first floor) to check-in and test prep.  All radiology patients and guests should use entrance C2 at [facility], accessed from [facility], even though the hospital's physical address listed is [address]  If scheduled at the Heart and Vascular Tower at [address], please enter the  parking lot using the [address] entrance and use the FREE valet service at the patient drop-off area. Enter the building and check-in with registration on the main floor.  If scheduled at [facility], please arrive to the Heart and Vascular Center 15 mins early for check-in and test prep.  There is spacious parking and easy access to the radiology department from the [facility] Heart and Vascular entrance. Please enter here and check-in with the desk attendant.   If scheduled at [facility], please arrive 30 minutes early for check-in and test prep.  Please follow these instructions carefully (unless otherwise directed):  An IV will be required for this test and Nitroglycerin will be given.  Hold all erectile dysfunction medications at least 3 days (72 hrs) prior to test. (Ie viagra, cialis, sildenafil, tadalafil, etc)   On the Night Before the Test: Be sure to Drink plenty of water. Do not consume any caffeinated/decaffeinated beverages or chocolate 12 hours prior to your test. Do not take any antihistamines 12 hours prior to your test.  On the Day of the Test: Drink plenty of water until 1 hour prior to the test. Do not eat any food 1 hour prior to test. You may take your regular medications prior to the test.  Take metoprolol (Lopressor) two hours prior to test. Patients who wear a continuous glucose monitor MUST remove the device prior to scanning.      After the Test: Drink plenty of water. After receiving IV contrast, you may experience a mild flushed feeling. This  is normal. On occasion, you may experience a mild rash up to 24 hours after the test. This is not dangerous. If this occurs, you can take Benadryl 25 mg, Zyrtec, Claritin, or Allegra and increase your fluid intake. (Patients taking Tikosyn should avoid Benadryl, and may take Zyrtec, Claritin, or Allegra) If you experience trouble breathing, this can be serious. If it is severe call 911  IMMEDIATELY. If it is mild, please call our office.  We will call to schedule your test 2-4 weeks out understanding that some insurance companies will need an authorization prior to the service being performed.   For more information and frequently asked questions, please visit our website : [email or web address]  For non-scheduling related questions, please contact the cardiac imaging nurse navigator should you have any questions/concerns: Cardiac Imaging Nurse Navigators Direct Office Dial: [phone]   For scheduling needs, including cancellations and rescheduling, please call [place], [phone].   Follow-Up: At [facility], you and your health needs are our priority.  As part of our continuing mission to provide you with exceptional heart care, our providers are all part of one team.  This team includes your primary Cardiologist (physician) and Advanced Practice Providers or APPs (Physician Assistants and Nurse Practitioners) who all work together to provide you with the care you need, when you need it.  Your next appointment:   2 month(s)  Provider:   Alean Kobus, MD    We recommend signing up for the patient portal called MyChart.  Sign up information is provided on this After Visit Summary.  MyChart is used to connect with patients for Virtual Visits (Telemedicine).  Patients are able to view lab/test results, encounter notes, upcoming appointments, etc.  Non-urgent messages can be sent to your provider as well.   To learn more about what you can do with MyChart, go to [email or web address].   Other Instructions None

## 2024-04-29 NOTE — Progress Notes (Signed)
 Cardiology Consultation:    Date:  04/29/2024   ID:  Majed Pellegrin, DOB 09-23-1950, MRN [id number]  PCP:  Fleeta Valeria Mayo, MD  Cardiologist:  Alean SAUNDERS Melady Chow, MD   Referring MD: Fleeta Valeria Mayo, MD   No chief complaint on file.    ASSESSMENT AND PLAN:   Mr. Corkery 73 year old male with history of Coronary atherosclerosis and aortic atherosclerosis noted on CT chest imaging, moderate aortic stenosis, mild aortic insufficiency, dyslipidemia  Here for follow-up visit and mentions progressive symptoms of dyspnea on exertion.  Problem List Items Addressed This Visit     Coronary artery calcification seen on CAT scan   Coronary atherosclerosis noted on prior CT chest. Now with progressive symptoms of dyspnea on exertion concerning for any obstructive coronary artery disease.  Proceed with cardiac CT coronary angiogram.  Continue aspirin 81 mg once daily. Continue atorvastatin  80 mg once daily.       Relevant Medications   metoprolol tartrate (LOPRESSOR) 100 MG tablet   Other Relevant Orders   CT CORONARY MORPH W/CTA COR W/SCORE W/CA W/CM &/OR WO/CM   Basic Metabolic Panel (BMET)   Hypercholesterolemia   Last lipid panel from 01/08/2024  total cholesterol 148, triglycerides 80, HDL 51, LDL 82.  Continue with atorvastatin  80 mg once daily. Continue with dietary modifications. Target LDL less than 70 mg/dL.  Will follow-up cardiac CT coronary angiogram being ordered. If significant coronary atherosclerosis burden will further add Zetia.      Relevant Medications   metoprolol tartrate (LOPRESSOR) 100 MG tablet   Aortic valve stenosis   Moderate aortic stenosis on echocardiogram 04/22/2024 associated with mild aortic insufficiency V-max 3.1 m/s and mean gradient 21 mmHg. Continue to monitor. Repeat echocardiogram tentatively in 1 year.       Relevant Medications   metoprolol tartrate (LOPRESSOR) 100 MG tablet   Dyspnea on exertion - Primary   Annual  equivalent. Relieves with rest.  Does have cardiovascular risk factors in the form of coronary atherosclerosis, age, dyslipidemia.  Will proceed with cardiac CTA coronary angiogram to evaluate for any significant obstructive disease.  Continue aspirin 81 mg once daily Continue atorvastatin  80 mg once daily.  If any significant change in functional status or symptoms occurring at rest or progressing, advised to inform us  right away.       Relevant Orders   CT CORONARY MORPH W/CTA COR W/SCORE W/CA W/CM &/OR WO/CM   Basic Metabolic Panel (BMET)   Return to clinic tentatively in 2 months   History of Present Illness:    Armas Mcbee is a 73 y.o. male who is being seen today for follow-up visit. PCP is Fleeta Valeria, Mayo, MD. Last visit with me in the office was 10/04/2023.  Here for the visit today accompanied by his wife.  Keeps himself busy with Day-To-Day Activities at Home.  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  Coronary atherosclerosis and aortic atherosclerosis noted on CT chest imaging, moderate aortic stenosis, mild aortic insufficiency, dyslipidemia  Echocardiogram from 04/22/2024 noted normal biventricular function LVEF 60 to 65%, normal diastolic function, normal RV size, mild aortic insufficiency and moderate aortic stenosis with mean gradient 21 mmHg, V-max 3.1 m/s.  Mild dilation of the ascending aorta measuring 3.7 cm [given body surface area falls within upper limits of normal].  Mentions he is out of breath after working for a bit. His wife is noticed he gets short of breath more than usual after working in the yard. Denies any symptoms  at rest.  Denies any chest pain. Denies any palpitations. Denies any lightheadedness, dizziness or syncopal episodes. He continues to take precautions avoiding sudden position changes.  Ultrasound carotids from March 2025 noted no significant carotid stenosis.  There was normal bilateral vertebral artery and subclavian  flow.  Lipid panel from 01/08/2024 total cholesterol 148, triglycerides 80, HDL 51, LDL 82. CMP with BUN 19, creatinine 1.11, eGFR 71. Sodium 140 and potassium 5.4.   Past Medical History:  Diagnosis Date   Aortic valve stenosis 06/08/2023   Arthritis    BMI 26.0-26.9,adult 06/08/2023   Chronic pain syndrome    Chronically on benzodiazepine therapy 06/01/2016   Common bile duct dilatation 03/14/2018   Coronary artery calcification seen on CAT scan 06/07/2022   Degenerative disc disease, lumbar    Gastritis    Gastroesophageal reflux disease with esophagitis without hemorrhage 06/07/2022   GERD (gastroesophageal reflux disease)    Heart murmur 06/07/2022   History of carpal tunnel release    Hypercalcemia 06/28/2022   Hypercholesterolemia    Insomnia 06/07/2022   Irritable bowel syndrome (IBS) 06/01/2016   Kidney stones    Left hip pain 01/08/2024   Left shoulder pain 05/30/2016   Lumbar back pain 08/02/2022   Mild mitral regurgitation 10/04/2023   Opioid use agreement exists 06/01/2016   Osteoarthritis of left hip 03/12/2024   Primary osteoarthritis involving multiple joints 06/08/2023   Seasonal allergies 06/08/2023   Spondylolisthesis 08/24/2018   Syncope 06/08/2023   Syncope and collapse 03/26/2023   Tear of left rotator cuff 11/05/2020   Added automatically from request for surgery [id number]     Transaminitis 03/14/2018    Past Surgical History:  Procedure Laterality Date   CARPAL TUNNEL RELEASE Right    CHOLECYSTECTOMY  03/14/2018   COLONOSCOPY WITH ESOPHAGOGASTRODUODENOSCOPY (EGD)  2019   SHOULDER SURGERY Bilateral    5 times resected    Current Medications: Current Meds  Medication Sig   ALPRAZolam  (XANAX  XR) 0.5 MG 24 hr tablet Take 1 tablet (0.5 mg total) by mouth at bedtime as needed for anxiety.   aspirin EC 81 MG tablet Take 81 mg by mouth daily. Swallow whole.   atorvastatin  (LIPITOR) 80 MG tablet TAKE 1 TABLET(80 MG) BY MOUTH AT BEDTIME    diclofenac Sodium (VOLTAREN) 1 % GEL Apply topically 4 (four) times daily.   diphenhydrAMINE (BENADRYL) 25 mg capsule Take 25 mg by mouth at bedtime.   ibuprofen (ADVIL) 200 MG tablet Take 200 mg by mouth at bedtime.   meloxicam (MOBIC) 15 MG tablet Take 15 mg by mouth daily.   metoprolol tartrate (LOPRESSOR) 100 MG tablet Take 1 tablet (100 mg total) by mouth once for 1 dose. Please take this medication 2 hours before CT.   pantoprazole  (PROTONIX ) 40 MG tablet Take 1 tablet (40 mg total) by mouth daily.     Allergies:   Morphine   Social History   Socioeconomic History   Marital status: Married    Spouse name: Reena   Number of children: 0   Years of education: Not on file   Highest education level: Not on file  Occupational History   Occupation: Retired   Tobacco Use   Smoking status: Never    Passive exposure: Never   Smokeless tobacco: Never  Vaping Use   Vaping status: Never Used  Substance and Sexual Activity   Alcohol  use: Not Currently   Drug use: Never   Sexual activity: Not on file  Other Topics Concern  Not on file  Social History Narrative   Not on file   Social Drivers of Health   Financial Resource Strain: Not on file  Food Insecurity: Not on file  Transportation Needs: Not on file  Physical Activity: Not on file  Stress: Not on file  Social Connections: Not on file     Family History: The patient's family history includes Breast cancer in his sister; Colon cancer in his brother and sister; Diabetes in his brother and brother; Esophageal cancer in his brother; Stomach cancer in his brother. There is no history of Colon polyps or Rectal cancer. ROS:   Please see the history of present illness.    All 14 point review of systems negative except as described per history of present illness.  EKGs/Labs/Other Studies Reviewed:    The following studies were reviewed today:   EKG:       Recent Labs: 06/08/2023: Hemoglobin 14.6; Platelets 335; TSH  1.390 01/08/2024: ALT 37; BUN 19; Creatinine, Ser 1.11; Potassium 5.4; Sodium 140  Recent Lipid Panel    Component Value Date/Time   CHOL 148 01/08/2024 1510   TRIG 80 01/08/2024 1510   HDL 51 01/08/2024 1510   CHOLHDL 2.9 01/08/2024 1510   LDLCALC 82 01/08/2024 1510    Physical Exam:    VS:  BP (!) 110/58   Pulse 70   Ht 6' (1.829 m)   Wt 195 lb (88.5 kg)   SpO2 98%   BMI 26.45 kg/m     Wt Readings from Last 3 Encounters:  04/29/24 195 lb (88.5 kg)  01/08/24 192 lb 6.4 oz (87.3 kg)  11/28/23 188 lb (85.3 kg)     GENERAL:  Well nourished, well developed in no acute distress NECK: No JVD; No carotid bruits CARDIAC: RRR, S1 and S2 present, 4/6 ejection systolic murmur best heard on right upper sternal border.   CHEST:  Clear to auscultation without rales, wheezing or rhonchi  Extremities: No pitting pedal edema. Pulses bilaterally symmetric with radial 2+ and dorsalis pedis 2+ NEUROLOGIC:  Alert and oriented x 3  Medication Adjustments/Labs and Tests Ordered: Current medicines are reviewed at length with the patient today.  Concerns regarding medicines are outlined above.  Orders Placed This Encounter  Procedures   CT CORONARY MORPH W/CTA COR W/SCORE W/CA W/CM &/OR WO/CM   Basic Metabolic Panel (BMET)   Meds ordered this encounter  Medications   metoprolol tartrate (LOPRESSOR) 100 MG tablet    Sig: Take 1 tablet (100 mg total) by mouth once for 1 dose. Please take this medication 2 hours before CT.    Dispense:  1 tablet    Refill:  0    Signed, Dali Kraner reddy Kyleigha Markert, MD, MPH, [facility]. 04/29/2024 12:23 PM    [facility]

## 2024-04-29 NOTE — Assessment & Plan Note (Signed)
 Moderate aortic stenosis on echocardiogram 04/22/2024 associated with mild aortic insufficiency V-max 3.1 m/s and mean gradient 21 mmHg. Continue to monitor. Repeat echocardiogram tentatively in 1 year.

## 2024-04-29 NOTE — Assessment & Plan Note (Signed)
 Last lipid panel from 01/08/2024  total cholesterol 148, triglycerides 80, HDL 51, LDL 82.  Continue with atorvastatin  80 mg once daily. Continue with dietary modifications. Target LDL less than 70 mg/dL.  Will follow-up cardiac CT coronary angiogram being ordered. If significant coronary atherosclerosis burden will further add Zetia.

## 2024-04-29 NOTE — Assessment & Plan Note (Signed)
 Coronary atherosclerosis noted on prior CT chest. Now with progressive symptoms of dyspnea on exertion concerning for any obstructive coronary artery disease.  Proceed with cardiac CT coronary angiogram.  Continue aspirin 81 mg once daily. Continue atorvastatin  80 mg once daily.

## 2024-04-29 NOTE — Assessment & Plan Note (Signed)
 Annual equivalent. Relieves with rest.  Does have cardiovascular risk factors in the form of coronary atherosclerosis, age, dyslipidemia.  Will proceed with cardiac CTA coronary angiogram to evaluate for any significant obstructive disease.  Continue aspirin 81 mg once daily Continue atorvastatin  80 mg once daily.  If any significant change in functional status or symptoms occurring at rest or progressing, advised to inform us  right away.

## 2024-04-30 LAB — BASIC METABOLIC PANEL WITH GFR
BUN/Creatinine Ratio: 17 (ref 10–24)
BUN: 24 mg/dL (ref 8–27)
CO2: 22 mmol/L (ref 20–29)
Calcium: 9.6 mg/dL (ref 8.6–10.2)
Chloride: 101 mmol/L (ref 96–106)
Creatinine, Ser: 1.38 mg/dL — ABNORMAL HIGH (ref 0.76–1.27)
Glucose: 90 mg/dL (ref 70–99)
Potassium: 4.8 mmol/L (ref 3.5–5.2)
Sodium: 138 mmol/L (ref 134–144)
eGFR: 54 mL/min/1.73 — ABNORMAL LOW (ref >59)

## 2024-05-08 ENCOUNTER — Telehealth: Payer: Self-pay

## 2024-05-08 NOTE — Telephone Encounter (Signed)
 Pt c/o medication issue:  1. Name of Medication:   meloxicam (MOBIC) 15 MG tablet    2. How are you currently taking this medication (dosage and times per day)? Take 15 mg by mouth daily.   3. Are you having a reaction (difficulty breathing--STAT)? No  4. What is your medication issue? Pt had labwork recently and his creatinine was 138. He feels it is due to the above medication. Pt would like to know if he should discontinue medication. He does not want his lab to effect his ability to get CT scheduled 10/23. Please advise.

## 2024-05-09 ENCOUNTER — Ambulatory Visit: Payer: Self-pay

## 2024-05-09 ENCOUNTER — Other Ambulatory Visit: Payer: Self-pay

## 2024-05-09 DIAGNOSIS — I2510 Atherosclerotic heart disease of native coronary artery without angina pectoris: Secondary | ICD-10-CM

## 2024-05-09 NOTE — Telephone Encounter (Signed)
 Called the patient and informed him of Dr. Madireddy's recommendation below:  Meloxicam can contribute. Okay to discontinue meloxicam and follow-up with PCP for pain management as this is a medication being prescribed by PCP. Keep yourself well-hydrated. Please order basic metabolic panel to be done next week around Wednesday or Thursday. I expect blood work to show improvement in creatinine numbers. I anticipate we will we will be able to proceed with cardiac CT coronary angiogram as currently scheduled towards the end of this month.  Patient verbalized understanding and had no further questions at this time. Labs were ordered via Epic.

## 2024-05-15 DIAGNOSIS — I2510 Atherosclerotic heart disease of native coronary artery without angina pectoris: Secondary | ICD-10-CM | POA: Diagnosis not present

## 2024-05-16 LAB — BASIC METABOLIC PANEL WITH GFR
BUN/Creatinine Ratio: 17 (ref 10–24)
BUN: 18 mg/dL (ref 8–27)
CO2: 22 mmol/L (ref 20–29)
Calcium: 10.2 mg/dL (ref 8.6–10.2)
Chloride: 104 mmol/L (ref 96–106)
Creatinine, Ser: 1.04 mg/dL (ref 0.76–1.27)
Glucose: 93 mg/dL (ref 70–99)
Potassium: 4.8 mmol/L (ref 3.5–5.2)
Sodium: 140 mmol/L (ref 134–144)
eGFR: 76 mL/min/1.73 (ref >59)

## 2024-05-19 ENCOUNTER — Encounter (HOSPITAL_COMMUNITY): Payer: Self-pay

## 2024-05-21 ENCOUNTER — Other Ambulatory Visit: Payer: Self-pay | Admitting: Internal Medicine

## 2024-05-21 DIAGNOSIS — G4700 Insomnia, unspecified: Secondary | ICD-10-CM

## 2024-05-22 ENCOUNTER — Ambulatory Visit (INDEPENDENT_AMBULATORY_CARE_PROVIDER_SITE_OTHER)
Admission: RE | Admit: 2024-05-22 | Discharge: 2024-05-22 | Disposition: A | Discharge status: Home / Self Care | Source: Ambulatory Visit

## 2024-05-22 ENCOUNTER — Other Ambulatory Visit: Payer: Self-pay | Admitting: Internal Medicine

## 2024-05-22 ENCOUNTER — Inpatient Hospital Stay (INDEPENDENT_AMBULATORY_CARE_PROVIDER_SITE_OTHER)
Admission: RE | Admit: 2024-05-22 | Discharge: 2024-05-22 | Disposition: A | Discharge status: Home / Self Care | Payer: Self-pay | Source: Ambulatory Visit

## 2024-05-22 DIAGNOSIS — I2510 Atherosclerotic heart disease of native coronary artery without angina pectoris: Secondary | ICD-10-CM

## 2024-05-22 DIAGNOSIS — R0609 Other forms of dyspnea: Secondary | ICD-10-CM

## 2024-05-22 DIAGNOSIS — R931 Abnormal findings on diagnostic imaging of heart and coronary circulation: Secondary | ICD-10-CM | POA: Diagnosis not present

## 2024-05-22 DIAGNOSIS — I700 Atherosclerosis of aorta: Secondary | ICD-10-CM | POA: Diagnosis not present

## 2024-05-22 DIAGNOSIS — G4700 Insomnia, unspecified: Secondary | ICD-10-CM

## 2024-05-22 MED ORDER — NITROGLYCERIN 0.4 MG SL SUBL
0.8000 mg | SUBLINGUAL_TABLET | Freq: Once | SUBLINGUAL | Status: AC
  Administered 2024-05-22: 0.8 mg via SUBLINGUAL

## 2024-05-22 MED ORDER — IOHEXOL 350 MG/ML SOLN
95.0000 mL | Freq: Once | INTRAVENOUS | Status: AC | PRN
  Administered 2024-05-22: 95 mL via INTRAVENOUS

## 2024-05-22 MED ORDER — ALPRAZOLAM ER 0.5 MG PO TB24: 0.5000 mg | ORAL_TABLET | Freq: Every evening | ORAL | 2 refills | Status: DC | PRN

## 2024-05-22 NOTE — Addendum Note (Signed)
 Addended by: LIBORIO HAI REDDY on: 05/22/2024 05:19 PM   Modules accepted: Orders

## 2024-05-23 ENCOUNTER — Other Ambulatory Visit: Payer: Self-pay

## 2024-06-09 ENCOUNTER — Encounter: Admitting: Internal Medicine

## 2024-06-23 ENCOUNTER — Encounter: Payer: Self-pay | Admitting: Internal Medicine

## 2024-06-23 ENCOUNTER — Ambulatory Visit: Admitting: Internal Medicine

## 2024-06-23 VITALS — BP 124/70 | HR 65 | Temp 97.5°F | Resp 18 | Ht 72.0 in | Wt 190.2 lb

## 2024-06-23 DIAGNOSIS — I350 Nonrheumatic aortic (valve) stenosis: Secondary | ICD-10-CM

## 2024-06-23 DIAGNOSIS — R3911 Hesitancy of micturition: Secondary | ICD-10-CM | POA: Diagnosis not present

## 2024-06-23 DIAGNOSIS — K219 Gastro-esophageal reflux disease without esophagitis: Secondary | ICD-10-CM

## 2024-06-23 DIAGNOSIS — Z23 Encounter for immunization: Secondary | ICD-10-CM | POA: Diagnosis not present

## 2024-06-23 DIAGNOSIS — J302 Other seasonal allergic rhinitis: Secondary | ICD-10-CM | POA: Diagnosis not present

## 2024-06-23 DIAGNOSIS — I700 Atherosclerosis of aorta: Secondary | ICD-10-CM | POA: Diagnosis not present

## 2024-06-23 DIAGNOSIS — M150 Primary generalized (osteo)arthritis: Secondary | ICD-10-CM | POA: Diagnosis not present

## 2024-06-23 DIAGNOSIS — Z6825 Body mass index (BMI) 25.0-25.9, adult: Secondary | ICD-10-CM | POA: Insufficient documentation

## 2024-06-23 DIAGNOSIS — G4700 Insomnia, unspecified: Secondary | ICD-10-CM | POA: Diagnosis not present

## 2024-06-23 DIAGNOSIS — K2100 Gastro-esophageal reflux disease with esophagitis, without bleeding: Secondary | ICD-10-CM | POA: Diagnosis not present

## 2024-06-23 DIAGNOSIS — I2510 Atherosclerotic heart disease of native coronary artery without angina pectoris: Secondary | ICD-10-CM

## 2024-06-23 DIAGNOSIS — R739 Hyperglycemia, unspecified: Secondary | ICD-10-CM

## 2024-06-23 DIAGNOSIS — E7800 Pure hypercholesterolemia, unspecified: Secondary | ICD-10-CM | POA: Diagnosis not present

## 2024-06-23 HISTORY — DX: Atherosclerosis of aorta: I70.0

## 2024-06-23 MED ORDER — ALPRAZOLAM ER 0.5 MG PO TB24: 0.5000 mg | ORAL_TABLET | Freq: Every evening | ORAL | 2 refills | Status: AC | PRN

## 2024-06-23 NOTE — Assessment & Plan Note (Signed)
 We will continue risk factor modification.  He will remain on lipitor and ASA.  They are setting him up for coronary CT angiogram.

## 2024-06-23 NOTE — Assessment & Plan Note (Signed)
 He is getting sensitized to bee stings.  I will write him for an EPI pen.

## 2024-06-23 NOTE — Assessment & Plan Note (Signed)
 He had an ECHO from 03/2024 where cardiology will repeat another ECHO next year.

## 2024-06-23 NOTE — Assessment & Plan Note (Signed)
 He denies any problems with reflux.

## 2024-06-23 NOTE — Assessment & Plan Note (Signed)
 He cannot exercise as much due to his hip pain but he will cotninue to work on this.  I want him to eat healthy.

## 2024-06-23 NOTE — Assessment & Plan Note (Signed)
 Continue on lipitor as needed.

## 2024-06-23 NOTE — Assessment & Plan Note (Signed)
 His goal LDL <70 with his cardiovascular risk factors.  He remains on lipitor and we will check his FLP today.

## 2024-06-23 NOTE — Assessment & Plan Note (Signed)
 He will continue on advil as needed.
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
## 2024-06-23 NOTE — Addendum Note (Signed)
 Addended by: LENETTA LACKS on: 06/23/2024 03:52 PM   Modules accepted: Orders

## 2024-06-24 LAB — LIPID PANEL
Chol/HDL Ratio: 2.9 ratio (ref 0.0–5.0)
Cholesterol, Total: 160 mg/dL (ref 100–199)
HDL: 55 mg/dL (ref >39)
LDL Chol Calc (NIH): 86 mg/dL (ref 0–99)
Triglycerides: 105 mg/dL (ref 0–149)
VLDL Cholesterol Cal: 19 mg/dL (ref 5–40)

## 2024-06-24 LAB — CBC WITH DIFFERENTIAL/PLATELET
Basophils Absolute: 0.1 x10E3/uL (ref 0.0–0.2)
Basos: 1 %
EOS (ABSOLUTE): 0.1 x10E3/uL (ref 0.0–0.4)
Eos: 1 %
Hematocrit: 44.4 % (ref 37.5–51.0)
Hemoglobin: 14.7 g/dL (ref 13.0–17.7)
Immature Grans (Abs): 0 x10E3/uL (ref 0.0–0.1)
Immature Granulocytes: 0 %
Lymphocytes Absolute: 1.9 x10E3/uL (ref 0.7–3.1)
Lymphs: 27 %
MCH: 33 pg (ref 26.6–33.0)
MCHC: 33.1 g/dL (ref 31.5–35.7)
MCV: 100 fL — ABNORMAL HIGH (ref 79–97)
Monocytes Absolute: 0.7 x10E3/uL (ref 0.1–0.9)
Monocytes: 10 %
Neutrophils Absolute: 4.2 x10E3/uL (ref 1.4–7.0)
Neutrophils: 61 %
Platelets: 281 x10E3/uL (ref 150–450)
RBC: 4.45 x10E6/uL (ref 4.14–5.80)
RDW: 12.4 % (ref 11.6–15.4)
WBC: 6.8 x10E3/uL (ref 3.4–10.8)

## 2024-06-24 LAB — CMP14 + ANION GAP
ALT: 22 IU/L (ref 0–44)
AST: 21 IU/L (ref 0–40)
Albumin: 4.5 g/dL (ref 3.8–4.8)
Alkaline Phosphatase: 108 IU/L (ref 47–123)
Anion Gap: 14 mmol/L (ref 10.0–18.0)
BUN/Creatinine Ratio: 15 (ref 10–24)
BUN: 16 mg/dL (ref 8–27)
Bilirubin Total: 0.5 mg/dL (ref 0.0–1.2)
CO2: 22 mmol/L (ref 20–29)
Calcium: 10.4 mg/dL — ABNORMAL HIGH (ref 8.6–10.2)
Chloride: 101 mmol/L (ref 96–106)
Creatinine, Ser: 1.04 mg/dL (ref 0.76–1.27)
Globulin, Total: 3.1 g/dL (ref 1.5–4.5)
Glucose: 90 mg/dL (ref 70–99)
Potassium: 4.6 mmol/L (ref 3.5–5.2)
Sodium: 137 mmol/L (ref 134–144)
Total Protein: 7.6 g/dL (ref 6.0–8.5)
eGFR: 76 mL/min/1.73 (ref >59)

## 2024-06-24 LAB — PSA: Prostate Specific Ag, Serum: 1 ng/mL (ref 0.0–4.0)

## 2024-06-24 LAB — HEMOGLOBIN A1C
Est. average glucose Bld gHb Est-mCnc: 114 mg/dL
Hgb A1c MFr Bld: 5.6 % (ref 4.8–5.6)

## 2024-06-24 LAB — TSH: TSH: 0.83 u[IU]/mL (ref 0.450–4.500)

## 2024-07-01 ENCOUNTER — Ambulatory Visit

## 2024-07-01 VITALS — BP 128/70 | HR 66 | Ht 72.0 in | Wt 193.8 lb

## 2024-07-01 DIAGNOSIS — I700 Atherosclerosis of aorta: Secondary | ICD-10-CM | POA: Insufficient documentation

## 2024-07-01 DIAGNOSIS — E7800 Pure hypercholesterolemia, unspecified: Secondary | ICD-10-CM | POA: Insufficient documentation

## 2024-07-01 DIAGNOSIS — I25118 Atherosclerotic heart disease of native coronary artery with other forms of angina pectoris: Secondary | ICD-10-CM | POA: Diagnosis not present

## 2024-07-01 DIAGNOSIS — I350 Nonrheumatic aortic (valve) stenosis: Secondary | ICD-10-CM | POA: Diagnosis not present

## 2024-07-01 MED ORDER — EZETIMIBE 10 MG PO TABS: 10.0000 mg | ORAL_TABLET | Freq: Every day | ORAL | 3 refills | Status: DC

## 2024-07-01 NOTE — Progress Notes (Signed)
 Cardiology Consultation:    Date:  07/01/2024   ID:  Gary Larson, DOB [birth year], MRN [id number]  PCP:  Fleeta Valeria Mayo, MD  Cardiologist:  Alean SAUNDERS [name], MD   Referring MD: Fleeta Valeria Mayo, MD   No chief complaint on file.    ASSESSMENT AND PLAN:   Gary Larson 73 year old male history of coronary atherosclerosis noted on CT chest imaging, moderate aortic stenosis, mild aortic insufficiency on echocardiogram 04/22/2024, dyslipidemia. Was evaluated for symptoms of dyspnea on exertion at last office visit and cardiac CT coronary angiogram was ordered.  Cardiac CT 05/22/2024 shows calcium  score 931, CAD RADS 3 study no hemodynamically significant lesions by CT FFR.  Aortic atherosclerosis noted.  Here for follow-up visit.  Problem List Items Addressed This Visit     CAD (coronary artery disease) - Primary   Reviewed results from cardiac CT coronary angiogram from 05/22/2024. Calcium  score 931 Dual variant LAD anatomy with CAD RADS 3 study, CT FFR showed low likelihood of hemodynamically significant lesions.  Continue aspirin 81 mg once daily Continue lipid-lowering therapy as discussed under hyperlipidemia. Continue atorvastatin  80 mg once daily.       Relevant Medications   ezetimibe (ZETIA) 10 MG tablet   Other Relevant Orders   EKG 12-Lead (Completed)   Hypercholesterolemia   Lipid panel from 06/23/2024 reviewed HDL 55, LDL 86. Target LDL less than 70 mg/dL.  He has been motivated and adjusted his diet significantly.  Plans on cutting down cheese consumption.  Discussed further escalation of lipid-lowering therapy with addition of Zetia 10 mg once daily. Discussed potential side effects. Agreeable to start.      Relevant Medications   ezetimibe (ZETIA) 10 MG tablet   Aortic valve stenosis   Moderate aortic stenosis associated with mild aortic insufficiency on echocardiogram September 2025. Repeat echocardiogram tentatively in 12 months around September 2026 and  follow-up in the office subsequently.        Relevant Medications   ezetimibe (ZETIA) 10 MG tablet   Aortic atherosclerosis   Management as discussed under CAD.      Relevant Medications   ezetimibe (ZETIA) 10 MG tablet   Return to clinic tentatively in September 2026 after follow-up echocardiogram to assess aortic stenosis.  Earlier follow-up on an as-needed basis.SABRA   History of Present Illness:    Gary Larson is a 73 y.o. male who is being seen today for follow-up visit. PCP is Fleeta Valeria, Mayo, MD. Last visit with me in the office was 04-29-2024.  Pleasant man here for the visit today accompanied by his wife.  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  Has a history of coronary atherosclerosis noted on CT chest imaging, moderate aortic stenosis, mild aortic insufficiency on echocardiogram 04/22/2024, dyslipidemia. Was evaluated for symptoms of dyspnea on exertion at last office visit and cardiac CT coronary angiogram was ordered.  Cardiac CT coronary angiogram completed 05/22/2024 shows calcium  score 931, with normal coronary origin with dual LAD variant anatomy, CAD RADS 3 study with moderate stenosis involving LAD with blooming artifact, CT FFR noted low likelihood of hemodynamically significant lesion.  Aortic atherosclerosis observed  Continues to do well. Mentions stays active through the day.  Exercise somewhat limited due to musculoskeletal issues, predominantly involving the back and left hip. Denies any chest pain. Does at times get short of breath after exerting physically outdoors.  No symptoms at rest. No palpitations, lightheadedness, dizziness or syncopal episodes. No pedal edema.  No blood  in urine or stools. Good compliance with his medications.  EKG in the clinic today shows sinus rhythm heart rate 66/min, PR interval 184 ms, QRS duration 92 ms, QTc normal 410 ms.  No ischemic changes.   Blood work from 06/23/2024 shows  total cholesterol 160, triglycerides 105, HDL 55, LDL 86. TSH normal 0.83. Hemoglobin 14.7, hematocrit 44.4, platelets 281 BUN 16, creatinine 1.04, eGFR 76. Normal transaminases and alkaline phosphatase. 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   Coronary artery calcification seen on CAT scan 06/07/2022   Degenerative disc disease, lumbar    Gastritis    Gastroesophageal reflux disease with esophagitis without hemorrhage 06/07/2022   GERD (gastroesophageal reflux disease)    Heart murmur 06/07/2022   Click   History of carpal tunnel release    Hypercalcemia 06/28/2022   Hypercholesterolemia    Insomnia 06/07/2022   Irritable bowel syndrome (IBS) 06/01/2016   Kidney stones    Left hip pain 01/08/2024   Left shoulder pain 05/30/2016   Lumbar back pain 08/02/2022   Mild mitral regurgitation 10/04/2023   Opioid use agreement exists 06/01/2016   Osteoarthritis of left hip 03/12/2024   Primary osteoarthritis involving multiple joints 06/08/2023   Seasonal allergies 06/08/2023   Spondylolisthesis 08/24/2018   Syncope 06/08/2023   Syncope and collapse 03/26/2023   Tear of left rotator cuff 11/05/2020   Added automatically from request for surgery [id number]     Transaminitis 03/14/2018    Past Surgical History:  Procedure Laterality Date   CARPAL TUNNEL RELEASE Right    CHOLECYSTECTOMY  03/14/2018   COLONOSCOPY WITH ESOPHAGOGASTRODUODENOSCOPY (EGD)  2019   SHOULDER SURGERY Bilateral    5 times resected    Current Medications: Current Meds  Medication Sig   ALPRAZolam  (XANAX  XR) 0.5 MG 24 hr tablet Take 1 tablet (0.5 mg total) by mouth at bedtime as needed for anxiety.   aspirin EC 81 MG  tablet Take 81 mg by mouth daily. Swallow whole.   atorvastatin  (LIPITOR) 80 MG tablet TAKE 1 TABLET(80 MG) BY MOUTH AT BEDTIME   diclofenac Sodium (VOLTAREN) 1 % GEL Apply topically 4 (four) times daily.   diphenhydrAMINE (BENADRYL) 25 mg capsule Take 25 mg by mouth at bedtime.   ezetimibe (ZETIA) 10 MG tablet Take 1 tablet (10 mg total) by mouth daily.   ibuprofen (ADVIL) 200 MG tablet Take 200 mg by mouth at bedtime.   pantoprazole  (PROTONIX ) 40 MG tablet Take 1 tablet (40 mg total) by mouth daily.     Allergies:   Morphine   Social History   Socioeconomic History   Marital status: Married    Spouse name: Reena   Number of children: 0   Years of education: Not on file   Highest education level: Not on file  Occupational History   Occupation: Retired   Tobacco Use   Smoking status: Never    Passive exposure: Never   Smokeless tobacco: Never  Vaping Use   Vaping status: Never Used  Substance and Sexual Activity   Alcohol  use: Not Currently   Drug use: Never   Sexual activity: Not on file  Other Topics Concern   Not on file  Social History Narrative   Not on  file   Social Drivers of Corporate Investment Banker Strain: Not on file  Food Insecurity: Not on file  Transportation Needs: Not on file  Physical Activity: Not on file  Stress: Not on file  Social Connections: Not on file     Family History: The patient's family history includes Breast cancer in his sister; Colon cancer in his brother and sister; Diabetes in his brother and brother; Esophageal cancer in his brother; Hyperlipidemia in his father; Hypertension in his brother; Stomach cancer in his brother. There is no history of Colon polyps or Rectal cancer. ROS:   Please see the history of present illness.    All 14 point review of systems negative except as described per history of present illness.  EKGs/Labs/Other Studies Reviewed:    The following studies were reviewed today:   EKG:  EKG  Interpretation Date/Time:  Tuesday July 01 2024 11:07:30 EST Ventricular Rate:  66 PR Interval:  184 QRS Duration:  92 QT Interval:  392 QTC Calculation: 410 R Axis:   67  Text Interpretation: Normal sinus rhythm Normal ECG When compared with ECG of 13-Jun-2023 14:01, No significant change was found Confirmed by Liborio Hai reddy [phone]) on 07/01/2024 11:32:46 AM    Recent Labs: 06/23/2024: ALT 22; BUN 16; Creatinine, Ser 1.04; Hemoglobin 14.7; Platelets 281; Potassium 4.6; Sodium 137; TSH 0.830  Recent Lipid Panel    Component Value Date/Time   CHOL 160 06/23/2024 1530   TRIG 105 06/23/2024 1530   HDL 55 06/23/2024 1530   CHOLHDL 2.9 06/23/2024 1530   LDLCALC 86 06/23/2024 1530    Physical Exam:    VS:  BP 128/70   Pulse 66   Ht 6' (1.829 m)   Wt 193 lb 12.8 oz (87.9 kg)   SpO2 99%   BMI 26.28 kg/m     Wt Readings from Last 3 Encounters:  07/01/24 193 lb 12.8 oz (87.9 kg)  06/23/24 190 lb 4 oz (86.3 kg)  04/29/24 195 lb (88.5 kg)     GENERAL:  Well nourished, well developed in no acute distress NECK: No JVD CARDIAC: RRR, S1 and S2 present, 4/6 ejection systolic murmur heard all over the precordium best heard right upper sternal border. CHEST:  Clear to auscultation without rales, wheezing or rhonchi  NEUROLOGIC:  Alert and oriented x 3  Medication Adjustments/Labs and Tests Ordered: Current medicines are reviewed at length with the patient today.  Concerns regarding medicines are outlined above.  Orders Placed This Encounter  Procedures   EKG 12-Lead   Meds ordered this encounter  Medications   ezetimibe (ZETIA) 10 MG tablet    Sig: Take 1 tablet (10 mg total) by mouth daily.    Dispense:  90 tablet    Refill:  3    Signed, Jerni Selmer reddy Statia Burdick, MD, MPH, [facility]. 07/01/2024 12:00 PM    [facility]

## 2024-07-01 NOTE — Assessment & Plan Note (Signed)
 Lipid panel from 06/23/2024 reviewed HDL 55, LDL 86. Target LDL less than 70 mg/dL.  He has been motivated and adjusted his diet significantly.  Plans on cutting down cheese consumption.  Discussed further escalation of lipid-lowering therapy with addition of Zetia 10 mg once daily. Discussed potential side effects. Agreeable to start.

## 2024-07-01 NOTE — Assessment & Plan Note (Signed)
 Reviewed results from cardiac CT coronary angiogram from 05/22/2024. Calcium  score 931 Dual variant LAD anatomy with CAD RADS 3 study, CT FFR showed low likelihood of hemodynamically significant lesions.  Continue aspirin 81 mg once daily Continue lipid-lowering therapy as discussed under hyperlipidemia. Continue atorvastatin  80 mg once daily.

## 2024-07-01 NOTE — Patient Instructions (Signed)
 Medication Instructions:  Your physician recommends that you make the following medication changes: Start taking Zetia 10 mg daily.  Please refer to the Current Medication list given to you today.  *If you need a refill on your cardiac medications before your next appointment, please call your pharmacy*   Lab Work: None ordered If you have labs (blood work) drawn today and your tests are completely normal, you will receive your results only by: MyChart Message (if you have MyChart) OR A paper copy in the mail If you have any lab test that is abnormal or we need to change your treatment, we will call you to review the results.   Testing/Procedures: Repeat echocardiogram in September 2026   Follow-Up: At [facility], you and your health needs are our priority.  As part of our continuing mission to provide you with exceptional heart care, we have created designated Provider Care Teams.  These Care Teams include your primary Cardiologist (physician) and Advanced Practice Providers (APPs -  Physician Assistants and Nurse Practitioners) who all work together to provide you with the care you need, when you need it.  We recommend signing up for the patient portal called MyChart.  Sign up information is provided on this After Visit Summary.  MyChart is used to connect with patients for Virtual Visits (Telemedicine).  Patients are able to view lab/test results, encounter notes, upcoming appointments, etc.  Non-urgent messages can be sent to your provider as well.   To learn more about what you can do with MyChart, go to [email or web address].    Your next appointment:   10 month(s)  The format for your next appointment:   In Person  Provider:   Alean Kobus, MD    Other Instructions none  Important Information About Sugar

## 2024-07-01 NOTE — Assessment & Plan Note (Signed)
 Management as discussed under CAD.

## 2024-07-01 NOTE — Assessment & Plan Note (Signed)
 Moderate aortic stenosis associated with mild aortic insufficiency on echocardiogram September 2025. Repeat echocardiogram tentatively in 12 months around September 2026 and follow-up in the office subsequently.

## 2024-07-14 ENCOUNTER — Ambulatory Visit: Payer: Self-pay

## 2024-07-14 NOTE — Progress Notes (Signed)
 Patient called.  Patient aware.  Per Dr. Fleeta Finger His labs look good but his calcium  level is a little high.  We will look at this again on his next visit. SABRA

## 2024-08-14 ENCOUNTER — Other Ambulatory Visit: Payer: Self-pay

## 2024-08-14 ENCOUNTER — Telehealth: Payer: Self-pay

## 2024-08-14 DIAGNOSIS — E782 Mixed hyperlipidemia: Secondary | ICD-10-CM

## 2024-08-14 NOTE — Telephone Encounter (Signed)
 Called the patient and informed him of Dr. Madireddy's recommendation below:  Stop Zetia . Check complete metabolic panel. Thank you  Patient verbalized understanding and had no further questions at this time. Zetia  medication was discontinued and the labs were ordered via Epic

## 2024-08-14 NOTE — Telephone Encounter (Signed)
 Pt c/o medication issue:  1. Name of Medication:   ezetimibe  (ZETIA ) 10 MG tablet   2. How are you currently taking this medication (dosage and times per day)?   As prescribed  3. Are you having a reaction (difficulty breathing--STAT)?   4. What is your medication issue?   Patient stated since starting on this medication he is now having a sharp pain in his liver, on his right side.  Patient stated the pain feels like someone is poking his right side.  Patient wants to know if he will need lab tests to check his liver.

## 2024-08-16 LAB — LIPID PANEL
Chol/HDL Ratio: 2.4 ratio (ref 0.0–5.0)
Cholesterol, Total: 121 mg/dL (ref 100–199)
HDL: 50 mg/dL
LDL Chol Calc (NIH): 55 mg/dL (ref 0–99)
Triglycerides: 84 mg/dL (ref 0–149)
VLDL Cholesterol Cal: 16 mg/dL (ref 5–40)

## 2024-08-16 LAB — COMPREHENSIVE METABOLIC PANEL WITH GFR
ALT: 33 IU/L (ref 0–44)
AST: 16 IU/L (ref 0–40)
Albumin: 4.4 g/dL (ref 3.8–4.8)
Alkaline Phosphatase: 139 IU/L — ABNORMAL HIGH (ref 47–123)
BUN/Creatinine Ratio: 13 (ref 10–24)
BUN: 15 mg/dL (ref 8–27)
Bilirubin Total: 0.4 mg/dL (ref 0.0–1.2)
CO2: 21 mmol/L (ref 20–29)
Calcium: 9.6 mg/dL (ref 8.6–10.2)
Chloride: 105 mmol/L (ref 96–106)
Creatinine, Ser: 1.13 mg/dL (ref 0.76–1.27)
Globulin, Total: 2.4 g/dL (ref 1.5–4.5)
Glucose: 96 mg/dL (ref 70–99)
Potassium: 4.6 mmol/L (ref 3.5–5.2)
Sodium: 142 mmol/L (ref 134–144)
Total Protein: 6.8 g/dL (ref 6.0–8.5)
eGFR: 69 mL/min/1.73

## 2024-12-17 ENCOUNTER — Ambulatory Visit: Admitting: Internal Medicine
